# Patient Record
Sex: Female | Born: 1950
Health system: Southern US, Community
[De-identification: ages and names within clinical notes are randomized; demographics above are authoritative.]

## PROBLEM LIST (undated history)

## (undated) DIAGNOSIS — K649 Unspecified hemorrhoids: Secondary | ICD-10-CM

## (undated) DIAGNOSIS — F419 Anxiety disorder, unspecified: Secondary | ICD-10-CM

## (undated) DIAGNOSIS — F329 Major depressive disorder, single episode, unspecified: Secondary | ICD-10-CM

## (undated) DIAGNOSIS — R030 Elevated blood-pressure reading, without diagnosis of hypertension: Secondary | ICD-10-CM

## (undated) DIAGNOSIS — R55 Syncope and collapse: Secondary | ICD-10-CM

## (undated) DIAGNOSIS — R569 Unspecified convulsions: Secondary | ICD-10-CM

## (undated) DIAGNOSIS — F32A Depression, unspecified: Secondary | ICD-10-CM

## (undated) DIAGNOSIS — G4089 Other seizures: Secondary | ICD-10-CM

## (undated) DIAGNOSIS — H469 Unspecified optic neuritis: Secondary | ICD-10-CM

## (undated) DIAGNOSIS — M810 Age-related osteoporosis without current pathological fracture: Secondary | ICD-10-CM

## (undated) DIAGNOSIS — G43909 Migraine, unspecified, not intractable, without status migrainosus: Secondary | ICD-10-CM

## (undated) DIAGNOSIS — G35 Multiple sclerosis: Secondary | ICD-10-CM

## (undated) DIAGNOSIS — I10 Essential (primary) hypertension: Secondary | ICD-10-CM

## (undated) DIAGNOSIS — K5190 Ulcerative colitis, unspecified, without complications: Secondary | ICD-10-CM

## (undated) HISTORY — DX: Ulcerative colitis, unspecified, without complications: K51.90

## (undated) HISTORY — DX: Age-related osteoporosis without current pathological fracture: M81.0

## (undated) HISTORY — DX: Anxiety disorder, unspecified: F41.9

## (undated) HISTORY — DX: Unspecified optic neuritis: H46.9

## (undated) HISTORY — DX: Elevated blood-pressure reading, without diagnosis of hypertension: R03.0

## (undated) HISTORY — DX: Unspecified convulsions: R56.9

## (undated) HISTORY — DX: Multiple sclerosis: G35

## (undated) HISTORY — DX: Unspecified hemorrhoids: K64.9

## (undated) HISTORY — DX: Major depressive disorder, single episode, unspecified: F32.9

## (undated) HISTORY — DX: Essential (primary) hypertension: I10

## (undated) HISTORY — DX: Migraine, unspecified, not intractable, without status migrainosus: G43.909

## (undated) HISTORY — PX: INCONTINENCE SURGERY: SHX676

## (undated) HISTORY — DX: Syncope and collapse: R55

## (undated) HISTORY — PX: OVARIAN CYST SURGERY: SHX726

## (undated) HISTORY — PX: FOOT SURGERY: SHX648

## (undated) HISTORY — DX: Depression, unspecified: F32.A

## (undated) HISTORY — PX: BREAST LUMPECTOMY: SHX2

---

## 1999-03-16 ENCOUNTER — Other Ambulatory Visit: Admission: RE | Admit: 1999-03-16 | Discharge: 1999-03-16 | Payer: Self-pay | Admitting: Obstetrics and Gynecology

## 2000-04-04 ENCOUNTER — Other Ambulatory Visit: Admission: RE | Admit: 2000-04-04 | Discharge: 2000-04-04 | Payer: Self-pay | Admitting: Obstetrics and Gynecology

## 2002-12-10 ENCOUNTER — Other Ambulatory Visit: Admission: RE | Admit: 2002-12-10 | Discharge: 2002-12-10 | Payer: Self-pay | Admitting: Obstetrics and Gynecology

## 2004-02-13 ENCOUNTER — Other Ambulatory Visit: Admission: RE | Admit: 2004-02-13 | Discharge: 2004-02-13 | Payer: Self-pay | Admitting: Obstetrics and Gynecology

## 2005-03-06 ENCOUNTER — Other Ambulatory Visit: Admission: RE | Admit: 2005-03-06 | Discharge: 2005-03-06 | Payer: Self-pay | Admitting: Obstetrics and Gynecology

## 2006-03-25 ENCOUNTER — Other Ambulatory Visit: Admission: RE | Admit: 2006-03-25 | Discharge: 2006-03-25 | Payer: Self-pay | Admitting: Obstetrics and Gynecology

## 2010-10-19 ENCOUNTER — Ambulatory Visit (HOSPITAL_COMMUNITY): Admission: RE | Admit: 2010-10-19 | Discharge: 2010-10-19 | Payer: Self-pay | Admitting: Obstetrics and Gynecology

## 2011-02-19 LAB — BASIC METABOLIC PANEL
BUN: 13 mg/dL (ref 6–23)
Calcium: 9.5 mg/dL (ref 8.4–10.5)
Creatinine, Ser: 0.6 mg/dL (ref 0.4–1.2)
GFR calc non Af Amer: >60 mL/min (ref >60)
Glucose, Bld: 79 mg/dL (ref 70–99)

## 2011-02-19 LAB — URINALYSIS, ROUTINE W REFLEX MICROSCOPIC
Ketones, ur: NEGATIVE mg/dL
Protein, ur: NEGATIVE mg/dL
Urobilinogen, UA: 0.2 mg/dL (ref 0.0–1.0)

## 2011-02-19 LAB — CBC
MCHC: 34 g/dL (ref 30.0–36.0)
RDW: 13.6 % (ref 11.5–15.5)

## 2011-02-25 ENCOUNTER — Other Ambulatory Visit (HOSPITAL_COMMUNITY): Payer: Self-pay | Admitting: Internal Medicine

## 2012-01-27 DIAGNOSIS — R55 Syncope and collapse: Secondary | ICD-10-CM | POA: Insufficient documentation

## 2012-01-27 DIAGNOSIS — G43109 Migraine with aura, not intractable, without status migrainosus: Secondary | ICD-10-CM | POA: Insufficient documentation

## 2012-01-27 DIAGNOSIS — G35 Multiple sclerosis: Secondary | ICD-10-CM | POA: Insufficient documentation

## 2012-01-27 DIAGNOSIS — I6789 Other cerebrovascular disease: Secondary | ICD-10-CM | POA: Insufficient documentation

## 2012-03-26 ENCOUNTER — Ambulatory Visit: Payer: Self-pay | Admitting: Women's Health

## 2013-02-27 ENCOUNTER — Other Ambulatory Visit: Payer: Self-pay | Admitting: Neurology

## 2013-03-18 ENCOUNTER — Other Ambulatory Visit: Payer: Self-pay

## 2013-03-18 MED ORDER — CLONAZEPAM 0.5 MG PO TABS: ORAL_TABLET | ORAL | Status: DC

## 2013-03-18 NOTE — Telephone Encounter (Signed)
Patient called requesting refill on Klonopin to last until OV in late May.

## 2013-04-27 ENCOUNTER — Encounter: Payer: Self-pay | Admitting: Neurology

## 2013-04-27 ENCOUNTER — Ambulatory Visit: Payer: Self-pay | Admitting: Neurology

## 2013-04-27 DIAGNOSIS — R55 Syncope and collapse: Secondary | ICD-10-CM

## 2013-04-27 DIAGNOSIS — G43109 Migraine with aura, not intractable, without status migrainosus: Secondary | ICD-10-CM

## 2013-04-27 DIAGNOSIS — I6789 Other cerebrovascular disease: Secondary | ICD-10-CM

## 2013-04-27 DIAGNOSIS — G35 Multiple sclerosis: Secondary | ICD-10-CM

## 2013-06-22 ENCOUNTER — Other Ambulatory Visit: Payer: Self-pay | Admitting: Neurology

## 2013-06-22 NOTE — Telephone Encounter (Signed)
Patient has not been seen since Feb 2013.  Had an appt in May 2014, which they did not show up for.  Now scheduled again for Jan 2015.

## 2013-08-31 ENCOUNTER — Other Ambulatory Visit: Payer: Self-pay | Admitting: Neurology

## 2013-12-14 ENCOUNTER — Ambulatory Visit (INDEPENDENT_AMBULATORY_CARE_PROVIDER_SITE_OTHER): Payer: BC Managed Care – PPO | Admitting: Neurology

## 2013-12-14 ENCOUNTER — Encounter: Payer: Self-pay | Admitting: Neurology

## 2013-12-14 ENCOUNTER — Encounter (INDEPENDENT_AMBULATORY_CARE_PROVIDER_SITE_OTHER): Payer: Self-pay

## 2013-12-14 VITALS — BP 132/65 | HR 81 | Wt 108.5 lb

## 2013-12-14 DIAGNOSIS — G35 Multiple sclerosis: Secondary | ICD-10-CM

## 2013-12-14 DIAGNOSIS — G43109 Migraine with aura, not intractable, without status migrainosus: Secondary | ICD-10-CM

## 2013-12-14 MED ORDER — CLONAZEPAM 0.5 MG PO TABS: ORAL_TABLET | ORAL | Status: DC

## 2013-12-14 MED ORDER — BACLOFEN 10 MG PO TABS: 10.0000 mg | ORAL_TABLET | Freq: Three times a day (TID) | ORAL | Status: DC | PRN

## 2013-12-14 NOTE — Patient Instructions (Signed)
Multiple Sclerosis Multiple sclerosis (MS) is a disease of the central nervous system. Its cause is unknown. It is more common in the northern states than in the southern states. There is a higher incidence of MS in women. There is a wide variation in the symptoms (problems) of MS. This is because of the many different ways it affects the central nervous system. It often comes on in episodes or attacks. These attacks may last weeks to months. There may be long periods of nearly no problems between attacks. The main symptoms include visual problems (associated with eye pain), numbness, weakness, and paralysis in extremities (arms/hands and legs/feet). There may also be tremors and problems with balance and walking. The age when MS starts is variable. Advances in medicine continue to improve the treatment of this illness. There is no known cure for MS but there are medications that help. MS is not an inherited illness, although your risk of getting this disease is higher if you have a relative with MS. The best radiologic (x-ray) study for MS is an MRI (magnetic resonance imaging). There are medications available to decrease the number and frequency of attacks. SYMPTOMS  The symptoms of MS are caused by loss of insulation (myelin) of the nerves of the brain. When this happens, brain signals do not get transmitted properly or may not get transmitted at all. Some of the problems caused by this include:   Numbness.  Weakness.  Paralysis in extremities.  Visual problems, eye pain.  Balance problems.  Tremors. DIAGNOSIS  Your caregiver can do studies on you to make this diagnosis. This may include specialized X-rays and spinal fluid studies. HOME CARE INSTRUCTIONS   Take medications as directed by your caregiver. Baclofen is a drug commonly used to reduce muscle spasticity. Steroids are often used for short term relief.  Exercise as directed.  Use physical and occupational therapy as directed by  your caregiver. Careful attention to this medical care can help avoid depression.  See your caregiver if you begin to have problems with depression. This is a common problem in MS. Patients often continue to work many years after the diagnosis of MS. Document Released: 11/22/2000 Document Revised: 02/17/2012 Document Reviewed: 07/01/2007 Exit[employer] Patient Information 2014 [employer].  

## 2013-12-14 NOTE — Progress Notes (Signed)
Reason for visit: Multiple sclerosis  Makayla Wade is an 63 y.o. female  History of present illness:  Makayla Wade is a 63 year old right-handed white female with a history of multiple sclerosis, but she is not on any disease modifying agents. The patient has done quite well over a number of years without any new symptoms. The patient is under a lot of stress with her job currently, and she will have intermittent numbness of the lower left leg. The patient describes no new weakness, balance changes, vision changes. The patient will have occasional blurring of the left eye. The patient in the past has had episodes of visual disturbance associated with classic migraine. The patient reports no significant problems with migraine headaches at this time. The patient is on baclofen and clonazepam, and this seemed to improve her symptoms. The patient returns for an evaluation. The patient denies any problems controlling the bowels or the bladder.  Past Medical History  Diagnosis Date  . Multiple sclerosis   . Syncope   . Seizures     Hx.   Marland Kitchen. Optic neuritis, right   . Depression     Hx.  Marland Kitchen. Hemorrhoids     Hx.  . Migraine headache   . Ulcerative colitis     Past Surgical History  Procedure Laterality Date  . Cesarean section    . Foot surgery    . Breast lumpectomy    . Ovarian cyst surgery    . Incontinence surgery      Family History  Problem Relation Age of Onset  . Cancer Mother   . Heart attack Father   . Cancer Father   . Cancer Maternal Grandmother   . Cancer Paternal Grandfather     Social history:  reports that she quit smoking about 45 years ago. She has never used smokeless tobacco. She reports that she drinks alcohol. She reports that she does not use illicit drugs.    Allergies  Allergen Reactions  . Sulfa Antibiotics     Medications:  Current Outpatient Prescriptions on File Prior to Visit  Medication Sig Dispense Refill  . alendronate (FOSAMAX) 70 MG  tablet Take 70 mg by mouth every 7 (seven) days. Take with a full glass of water on an empty stomach.      . Calcium Carbonate-Vitamin D (CALTRATE 600+D) 600-400 MG-UNIT per tablet Take 1 tablet by mouth daily.      . cholecalciferol (VITAMIN D) 1000 UNITS tablet Take 1,000 Units by mouth daily.       No current facility-administered medications on file prior to visit.    ROS:  Out of a complete 14 system review of symptoms, the patient complains only of the following symptoms, and all other reviewed systems are negative.  Achy muscles, muscle cramps, coordination problems with fatigue  Urinary urgency  Blood pressure 132/65, pulse 81, weight 108 lb 8 oz (49.215 kg).  Physical Exam  General: The patient is alert and cooperative at the time of the examination.  Skin: No significant peripheral edema is noted.   Neurologic Exam  Mental status: The patient is oriented x 3.  Cranial nerves: Facial symmetry is present. Speech is normal, no aphasia or dysarthria is noted. Extraocular movements are full. Visual fields are full. Pupils are equal, round, and reactive to light. Discs are flat bilaterally.  Motor: The patient has good strength in all 4 extremities.  Sensory examination: Soft touch sensation on the face, arms, and legs is symmetric, with  exception of some decreased soft touch sensation on the left foot.  Coordination: The patient has good finger-nose-finger and heel-to-shin bilaterally.  Gait and station: The patient has a normal gait. Tandem gait is normal. Romberg is negative. No drift is seen.  Reflexes: Deep tendon reflexes are symmetric.   Assessment/Plan:  One. Multiple sclerosis  2. Classic migraine  The patient is doing quite well with her neurologic symptoms at this point. The patient was given prescriptions for her baclofen and her clonazepam. The patient was last seen through this office on 01/27/2012. The patient will followup in one year. The last MRI  the brain was done in 2010.  Marlan Palau MD 12/14/2013 7:36 PM  [facility] [address] [place], [place] [address]  Phone [phone] Fax [phone]

## 2014-04-25 ENCOUNTER — Telehealth: Payer: Self-pay | Admitting: Neurology

## 2014-04-25 NOTE — Telephone Encounter (Signed)
Tried to call patient, the number left was a voice message for a Dillon Bjork.  I tried the two other numbers listed, [phone] wrong number and [phone] no longer in service.   I left a message for the patient to return call at the [phone] that was left.  I also left message at [phone], which is emergency contact #, and asked patient to return call to office, or if emergency to go to ER.

## 2014-04-25 NOTE — Telephone Encounter (Signed)
Patient is still waiting, called and is very upset that she hasn't heard back yet. She wants to be seen today. Advised patient that if she is in a lot of pain she should go to the urgent care.

## 2014-04-25 NOTE — Telephone Encounter (Signed)
I called the patient again and I left another message. The patient is describing stroke like symptoms, she needs to go to the ER for an evaluation. I have left this message several times, and I was able to reach the son as well who was going to contact the patient.

## 2014-04-25 NOTE — Telephone Encounter (Signed)
I called the number is given, unable to reach the patient. I finally talked with the son, who is not aware that the patient was having any problems. He indicated that she has lost her cell phone, and she is using her uncle's cell phone at [phone]. I have left messages on his telephone, unable to reach the patient.

## 2014-04-25 NOTE — Telephone Encounter (Signed)
I called the patient again, left a message, I will call back later. 

## 2014-04-25 NOTE — Telephone Encounter (Signed)
Error

## 2014-04-25 NOTE — Telephone Encounter (Signed)
Patient calling and stating she feels terrible, she has L foot and hand heaviness with lack of coordination.  She has had a really bad headache over the weekend, but now a dull ache.  She slept for 10 hrs last night and feel exhausted.  Requesting an appointment for today.

## 2014-04-26 ENCOUNTER — Encounter: Payer: Self-pay | Admitting: Adult Health

## 2014-04-26 ENCOUNTER — Telehealth: Payer: Self-pay | Admitting: Neurology

## 2014-04-26 ENCOUNTER — Ambulatory Visit (INDEPENDENT_AMBULATORY_CARE_PROVIDER_SITE_OTHER): Payer: 59 | Admitting: Adult Health

## 2014-04-26 ENCOUNTER — Encounter (INDEPENDENT_AMBULATORY_CARE_PROVIDER_SITE_OTHER): Payer: Self-pay

## 2014-04-26 VITALS — BP 127/80 | HR 76 | Temp 98.4°F | Ht 61.0 in | Wt 109.0 lb

## 2014-04-26 DIAGNOSIS — G35 Multiple sclerosis: Secondary | ICD-10-CM

## 2014-04-26 DIAGNOSIS — G43109 Migraine with aura, not intractable, without status migrainosus: Secondary | ICD-10-CM

## 2014-04-26 NOTE — Telephone Encounter (Signed)
Called patient to see if she is able to come in @ 3:30 she will be on Megan's schedule but Dr. Anne Hahn will come in to see her.

## 2014-04-26 NOTE — Progress Notes (Signed)
PATIENT: Makayla Wade DOB: February 18, 1951  REASON FOR VISIT: follow up HISTORY FROM: patient  HISTORY OF PRESENT ILLNESS: Ms. Makayla Wade is a 63 year old right-handed white female with a history of multiple sclerosis and migraines. Patent is not currently on any disease modifying agents. The patient is on baclofen and clonazepam. Saturday into Sunday she had a severe headache, patient was having trouble using her left hand and leg. She was able to walk and use the left arm but had numbness in the arm and leg. She has had ocular migraines before but this headache was not consistent with previous migraines. She was working outside in the heat Saturday then the symptoms started. By Sunday she was completely exhausted and that night she slept for 10 hours. The numbness has improved but not resolved. She continues to have a dull headache that improves with tylenol. In the past the patient would have intermittent numbness of the left lower leg. Denies any changes in her speech. Denies any visual changes. Denies problems with the bladder and bowels. No new medical issues since the last visit.  REVIEW OF SYSTEMS: Full 14 system review of systems performed and notable only for:  Constitutional: N/A  Eyes: N/A Ear/Nose/Throat: N/A  Skin: N/A  Cardiovascular: N/A  Respiratory: N/A  Gastrointestinal: N/A  Genitourinary: N/A Hematology/Lymphatic: N/A  Endocrine: N/A Musculoskeletal:N/A  Allergy/Immunology: N/A  Neurological: headache, numbness, weakness Psychiatric: N/A Sleep: N/A   ALLERGIES: Allergies  Allergen Reactions  . Sulfa Antibiotics     Funny sensations, rapid heartbeats    HOME MEDICATIONS: Outpatient Prescriptions Prior to Visit  Medication Sig Dispense Refill  . alendronate (FOSAMAX) 70 MG tablet Take 70 mg by mouth every 7 (seven) days. Take with a full glass of water on an empty stomach.      . baclofen (LIORESAL) 10 MG tablet Take 1 tablet (10 mg total) by mouth 3 (three)  times daily as needed for muscle spasms.  90 tablet  5  . Calcium Carbonate-Vitamin D (CALTRATE 600+D) 600-400 MG-UNIT per tablet Take 1 tablet by mouth daily.      . cholecalciferol (VITAMIN D) 1000 UNITS tablet Take 1,000 Units by mouth daily.      . clonazePAM (KLONOPIN) 0.5 MG tablet One tablet in the morning and 1/2 tablet in the evening  45 tablet  5  . Multiple Vitamins-Minerals (CENTRUM SILVER ADULT 50+ PO) Take 1 tablet by mouth daily.       No facility-administered medications prior to visit.    PAST MEDICAL HISTORY: Past Medical History  Diagnosis Date  . Multiple sclerosis   . Syncope   . Seizures     Hx.   Marland Kitchen Optic neuritis, right   . Depression     Hx.  Marland Kitchen Hemorrhoids     Hx.  . Migraine headache   . Ulcerative colitis     PAST SURGICAL HISTORY: Past Surgical History  Procedure Laterality Date  . Cesarean section    . Foot surgery    . Breast lumpectomy    . Ovarian cyst surgery    . Incontinence surgery      FAMILY HISTORY: Family History  Problem Relation Age of Onset  . Cancer Mother   . Heart attack Father   . Cancer Father   . Cancer Maternal Grandmother   . Cancer Paternal Grandfather     SOCIAL HISTORY: History   Social History  . Marital Status: Divorced    Spouse Name: N/A    Number  of Children: 3  . Years of Education: HS   Occupational History  . Editor, commissioning     [facility] History Main Topics  . Smoking status: Former Smoker    Quit date: 12/09/1968  . Smokeless tobacco: Never Used  . Alcohol Use: Yes     Comment: Consumes 2 glasses of wine daily  . Drug Use: No  . Sexual Activity: Not on file   Other Topics Concern  . Not on file   Social History Narrative   Patient is right handed,resides alone      PHYSICAL EXAM  Filed Vitals:   04/26/14 1543  BP: 127/80  Pulse: 76  Temp: 98.4 F (36.9 C)  TempSrc: Oral  Height: _0  (1.549 m)  Weight: 109 lb (49.442 kg)   Body  mass index is 20.61 kg/(m^2).  Generalized: Well developed, in no acute distress   Neurological examination  Mentation: Alert oriented to time, place, history taking. Follows all commands speech and language fluent Cranial nerve II-XII: Fundoscopic exam reveals sharp disc margins.Pupils were equal round reactive to light extraocular movements were full, visual field were full on confrontational test. Facial sensation and strength were normal. hearing was intact to finger rubbing bilaterally.  Motor: The motor testing reveals 5 over 5 strength of all 4 extremities. Good symmetric motor tone is noted throughout.  Sensory: Sensory testing is intact to  soft touch and vibration sensation on all 4 extremities.Decreased pinprick in left foot up to mid calf and in the left hand to the wrist. No evidence of extinction is noted.  Coordination: Cerebellar testing reveals good finger-nose-finger and heel-to-shin bilaterally.  Gait and station: Gait is normal. Tandem gait is minimally unsteady. Romberg is negative. No drift is seen.  Reflexes: Deep tendon reflexes are symmetric and normal bilaterally. Toes are downgoing bilaterally.   DIAGNOSTIC DATA (LABS, IMAGING, TESTING) - I reviewed patient records, labs, notes, testing and imaging myself where available.  Lab Results  Component Value Date   WBC 4.5 10/16/2010   HGB 13.1 10/16/2010   HCT 38.6 10/16/2010   MCV 99.2 10/16/2010   PLT 205 10/16/2010      Component Value Date/Time   NA 142 10/16/2010 1000   K 4.3 10/16/2010 1000   CL 108 10/16/2010 1000   CO2 30 10/16/2010 1000   GLUCOSE 79 10/16/2010 1000   BUN 13 10/16/2010 1000   CREATININE 0.60 10/16/2010 1000   CALCIUM 9.5 10/16/2010 1000   GFRNONAA >60 10/16/2010 1000   GFRAA  Value: >60        The eGFR has been calculated using the MDRD equation. This calculation has not been validated in all clinical situations. eGFR's persistently <60 mL/min signify possible Chronic Kidney Disease. 10/16/2010 1000      ASSESSMENT AND PLAN 63 y.o. year old female  has a past medical history of Multiple sclerosis; Syncope; Seizures; Optic neuritis, right; Depression; Hemorrhoids; Migraine headache; and Ulcerative colitis. here with:  1. Multiple sclerosis 2. Migraine with aura, without mention of intractable migraine without mention of status migrainosus  Patient having numbness in left hand and increased numbness in left leg also accompanied by a headache that started Saturday after working outdoors. She states she was completely fatigued on Sunday and slept for 10 hours that night. This most likely is consistent with MS. Patient has not had a repeat MRI since 2010. We will order an MRI to look for progression of MS. Patient is claustrophobic,  we will give her xanax to take prior to MRI. Patient should follow up in 6 months or sooner if needed.    Ward Givens, MSN, NP-C 04/26/2014, 3:53 PM [facility] [address], [place], [place] [address] [phone]  Note: This document was prepared with digital dictation and possible smart phrase technology. Any transcriptional errors that result from this process are unintentional.

## 2014-04-26 NOTE — Telephone Encounter (Signed)
Pt called very upset and states no one listen to her, that all the #'s that we called were all the #'s she request not to call. Please call pt back at [phone].  °

## 2014-04-26 NOTE — Telephone Encounter (Signed)
Pt called very upset and states no one listen to her, that all the #'s that we called were all the #'s she request not to call. Please call pt back at [phone].

## 2014-04-26 NOTE — Patient Instructions (Signed)
Multiple Sclerosis  Multiple sclerosis (MS) is a disease of the central nervous system. It leads to loss of the insulating covering of the nerves (myelin sheath) of your brain. When this happens, brain signals do not get transmitted properly or may not get transmitted at all. The symptoms of MS occur in episodes or attacks. These attacks may last weeks to months. There may be long periods of nearly no problems between attacks. The age of onset of MS varies.   CAUSES  The cause of MS is unknown. However, it is more common in the [place] than in the southern [place].  RISK FACTORS  There is a higher incidence of MS in women than in men. MS is not an inherited illness, although your risk of MS is higher if you have a relative with MS.  SIGNS AND SYMPTOMS   The symptoms of MS occur in episodes or attacks. These attacks may last weeks to months. There may be long periods of almost no symptoms between attacks.  The symptoms of MS vary. This is because of the many different ways it affects the central nervous system. The main symptoms of MS include:   Vision problems and eye pain.   Numbness.   Weakness.   Paralysis in your arms, hands, feet, and legs (extremities).   Balance problems.   Tremors.  DIAGNOSIS   Your health care provider can diagnose MS with the help of imaging exams and lab tests. These may include specialized X-ray exams and spinal fluid tests. The best imaging exam to confirm a diagnosis of MS is MRI.  TREATMENT   There is no known cure for MS, but there are medicines that can decrease the number and frequency of attacks. Steroids are often used for short-term relief. Physical and occupational therapy may also help.  HOME CARE INSTRUCTIONS    Take medicines as directed by your health care provider.   Exercise as directed by your health care provider.  SEEK MEDICAL CARE IF:  You begin to feel depressed.  SEEK IMMEDIATE MEDICAL CARE IF:   You develop paralysis.   You develop  problems with bladder, bowel, or sexual function.   You develop mental changes, such as forgetfulness or mood swings.   You have a seizure.  Document Released: 11/22/2000 Document Revised: 09/15/2013 Document Reviewed: 08/02/2013  [employer] Patient Information 2014 [employer].

## 2014-04-26 NOTE — Progress Notes (Signed)
I have read the note, and I agree with the clinical assessment and plan.  Treasure Ochs K Kanesha Cadle   

## 2014-05-19 ENCOUNTER — Ambulatory Visit
Admission: RE | Admit: 2014-05-19 | Discharge: 2014-05-19 | Disposition: A | Discharge status: Home / Self Care | Payer: 59 | Source: Ambulatory Visit | Attending: Adult Health | Admitting: Adult Health

## 2014-05-19 ENCOUNTER — Other Ambulatory Visit: Payer: Self-pay

## 2014-05-19 DIAGNOSIS — G35 Multiple sclerosis: Secondary | ICD-10-CM

## 2014-05-19 MED ORDER — GADOBENATE DIMEGLUMINE 529 MG/ML IV SOLN
9.0000 mL | Freq: Once | INTRAVENOUS | Status: AC | PRN
  Administered 2014-05-19: 9 mL via INTRAVENOUS

## 2014-05-19 MED ORDER — GADOBENATE DIMEGLUMINE 529 MG/ML IV SOLN: 9.0000 mL | Freq: Once | INTRAVENOUS | Status: AC | PRN

## 2014-05-20 ENCOUNTER — Telehealth: Payer: Self-pay | Admitting: Adult Health

## 2014-05-20 NOTE — Progress Notes (Signed)
Quick Note:  Shared results with patient, and wanted to know how does this explain what his happening to her now ______

## 2014-05-20 NOTE — Telephone Encounter (Signed)
I called the patient. Dr. Pearlean Brownie compared the two MRI scans and reports that there has been no change or progression in her MS. I called to let the patient know. She verbalized understanding. I explained that the left hand weakness and left leg weakness was most likely an exacerbation of her MS. She verbalized understanding.

## 2014-05-20 NOTE — Telephone Encounter (Signed)
I called the patient to let her know that her MRI had not been compared to the previous MRI. I have requested that Dr. Pearlean BrownieSethi compare the 2 scans. I advised the patient that I would call her back this afternoon once he has read them to tell her the results.

## 2014-05-20 NOTE — Telephone Encounter (Signed)
Message copied by Enedina FinnerMILLIKAN, Dodi Leu P on Fri May 20, 2014 11:20 AM ------      Message from: Harlon FlorSOUTHERLAND, AMMIE L      Created: Fri May 20, 2014 11:02 AM       Shared results with patient, and wanted to know how does this explain what his happening to her now ------

## 2014-06-16 NOTE — Telephone Encounter (Signed)
done

## 2014-07-09 ENCOUNTER — Other Ambulatory Visit: Payer: Self-pay | Admitting: Neurology

## 2014-07-11 NOTE — Telephone Encounter (Signed)
Rx signed and faxed.

## 2014-09-20 ENCOUNTER — Other Ambulatory Visit: Payer: Self-pay | Admitting: Neurology

## 2014-10-26 ENCOUNTER — Encounter: Payer: Self-pay | Admitting: Neurology

## 2014-10-28 ENCOUNTER — Ambulatory Visit: Payer: 59 | Admitting: Adult Health

## 2014-11-01 ENCOUNTER — Encounter: Payer: Self-pay | Admitting: Neurology

## 2014-11-01 ENCOUNTER — Ambulatory Visit: Payer: 59 | Admitting: Neurology

## 2014-12-14 ENCOUNTER — Ambulatory Visit (INDEPENDENT_AMBULATORY_CARE_PROVIDER_SITE_OTHER): Payer: 59 | Admitting: Neurology

## 2014-12-14 ENCOUNTER — Encounter: Payer: Self-pay | Admitting: Neurology

## 2014-12-14 VITALS — BP 141/83 | HR 74 | Ht 59.0 in | Wt 113.0 lb

## 2014-12-14 DIAGNOSIS — G35 Multiple sclerosis: Secondary | ICD-10-CM

## 2014-12-14 DIAGNOSIS — G43109 Migraine with aura, not intractable, without status migrainosus: Secondary | ICD-10-CM

## 2014-12-14 NOTE — Progress Notes (Signed)
Reason for visit: Multiple sclerosis  Makayla Wade is an 64 y.o. female  History of present illness:  Ms. Due is a 64 year old right-handed white female with a history of multiple sclerosis since the 34s. The patient has done extremely well over the years, but in late spring of 2015, she had onset of some left-sided weakness that has improved over time. MRI of the brain did not show any clear progression from a prior study done 5 years before. The patient has not been on any disease modifying agents, she is concerned about the potential side effects. The patient has also had some problems with migraine headaches, but more recently, the migraine has essentially gone away. She has not noted any other new symptoms of numbness, weakness, gait disturbance, or visual complaints. The patient denies any significant problems with memory or concentration. She recently lost her job, and she is concerned about this issue. She returns for an evaluation.  Past Medical History  Diagnosis Date  . Multiple sclerosis   . Syncope   . Seizures     Hx.   Marland Kitchen Optic neuritis, right   . Depression     Hx.  Marland Kitchen Hemorrhoids     Hx.  . Migraine headache   . Ulcerative colitis     Past Surgical History  Procedure Laterality Date  . Cesarean section    . Foot surgery    . Breast lumpectomy    . Ovarian cyst surgery    . Incontinence surgery      Family History  Problem Relation Age of Onset  . Cancer Mother   . Heart attack Father   . Cancer Father   . Cancer Maternal Grandmother   . Cancer Paternal Grandfather     Social history:  reports that she quit smoking about 46 years ago. She has never used smokeless tobacco. She reports that she drinks alcohol. She reports that she does not use illicit drugs.    Allergies  Allergen Reactions  . Sulfa Antibiotics     Funny sensations, rapid heartbeats    Medications:  Current Outpatient Prescriptions on File Prior to Visit  Medication Sig  Dispense Refill  . baclofen (LIORESAL) 10 MG tablet Take 1 tablet (10 mg total) by mouth 3 (three) times daily as needed for muscle spasms. 90 tablet 5  . Calcium Carbonate-Vitamin D (CALTRATE 600+D) 600-400 MG-UNIT per tablet Take 1 tablet by mouth daily.    . cholecalciferol (VITAMIN D) 1000 UNITS tablet Take 1,000 Units by mouth daily.    . clonazePAM (KLONOPIN) 0.5 MG tablet TAKE 1 TABLET BY MOUTH IN THE MORNING AND 1/2 TABLET IN THE EVENING 45 tablet 5  . Multiple Vitamins-Minerals (CENTRUM SILVER ADULT 50+ PO) Take 1 tablet by mouth daily.     No current facility-administered medications on file prior to visit.    ROS:  Out of a complete 14 system review of symptoms, the patient complains only of the following symptoms, and all other reviewed systems are negative.  Blurred vision Fatigue  Blood pressure 141/83, pulse 74, height  (1.499 m), weight 113 lb (51.256 kg).  Physical Exam  General: The patient is alert and cooperative at the time of the examination.  Skin: No significant peripheral edema is noted.   Neurologic Exam  Mental status: The patient is oriented x 3.  Cranial nerves: Facial symmetry is present. Speech is normal, no aphasia or dysarthria is noted. Extraocular movements are full. Visual fields are  full.  Motor: The patient has good strength in all 4 extremities.  Sensory examination: Soft touch sensation is symmetric on the face, arms, and legs.  Coordination: The patient has good finger-nose-finger and heel-to-shin bilaterally.  Gait and station: The patient has a normal gait. Tandem gait is normal. Romberg is negative. No drift is seen.  Reflexes: Deep tendon reflexes are symmetric.   MRI brain 05/19/14:  IMPRESSION: Multiple foci of white matter T2 signal abnormality, consistent with multiple sclerosis. No enhancing lesions seen to suggest active demyelination.    Assessment/Plan:  1. Multiple sclerosis  2. Migraine  headache  The patient is doing relatively well at this point. She has not had any new symptoms of multiple sclerosis. Her migraine headaches are much better. She will continue to be monitored. She indicates that she gets annual blood work done through her [facility] physician. She will follow-up in 8 months.  Marlan Palau MD 12/14/2014 11:11 AM  [facility] [address] [place], [place] [address]  Phone [phone] Fax [phone]

## 2014-12-14 NOTE — Patient Instructions (Signed)

## 2015-01-11 ENCOUNTER — Other Ambulatory Visit: Payer: Self-pay | Admitting: Neurology

## 2015-01-11 NOTE — Telephone Encounter (Signed)
Rx signed and faxed.

## 2015-04-12 ENCOUNTER — Telehealth: Payer: Self-pay | Admitting: Neurology

## 2015-04-12 NOTE — Telephone Encounter (Signed)
The patient was seen by her ophthalmologist. She sees Dr. Lemar Lofty. No significant ocular. findings were noted.

## 2015-08-02 ENCOUNTER — Other Ambulatory Visit: Payer: Self-pay | Admitting: Neurology

## 2015-08-03 ENCOUNTER — Other Ambulatory Visit: Payer: Self-pay

## 2015-08-03 MED ORDER — CLONAZEPAM 0.5 MG PO TABS: ORAL_TABLET | ORAL | Status: DC

## 2015-08-03 NOTE — Telephone Encounter (Signed)
Last prescribed by Dr Anne Hahn on 01/11/2015

## 2015-08-04 NOTE — Telephone Encounter (Signed)
Rx was signed and faxed   

## 2015-08-15 ENCOUNTER — Ambulatory Visit: Payer: 59 | Admitting: Neurology

## 2015-08-24 ENCOUNTER — Telehealth: Payer: Self-pay

## 2015-08-24 NOTE — Telephone Encounter (Signed)
Called and spoke to patient to offer her apt with [facility] Patient relayed she only want's to see Dr. Anne Hahn.

## 2015-08-24 NOTE — Telephone Encounter (Signed)
Left voicemail asking the patient to call back about her upcoming appointment. I would like to move her to Megan's schedule if she does not mind.

## 2015-09-13 ENCOUNTER — Ambulatory Visit: Payer: 59 | Admitting: Neurology

## 2015-09-14 ENCOUNTER — Other Ambulatory Visit: Payer: Self-pay | Admitting: Adult Health

## 2015-09-15 ENCOUNTER — Other Ambulatory Visit: Payer: Self-pay

## 2015-09-15 MED ORDER — CLONAZEPAM 0.5 MG PO TABS: ORAL_TABLET | ORAL | Status: DC

## 2015-09-15 NOTE — Telephone Encounter (Signed)
Has appt scheduled in Feb  

## 2015-09-15 NOTE — Telephone Encounter (Signed)
Rx signed and faxed.

## 2015-10-17 ENCOUNTER — Telehealth: Payer: Self-pay | Admitting: Neurology

## 2015-10-17 MED ORDER — BACLOFEN 10 MG PO TABS: 10.0000 mg | ORAL_TABLET | Freq: Three times a day (TID) | ORAL | Status: DC | PRN

## 2015-10-17 NOTE — Telephone Encounter (Signed)
Pt needs refill on baclofen (LIORESAL) 10 MG tablet. Please sen to  [facility], phone [phone], fax: [phone]

## 2015-10-17 NOTE — Telephone Encounter (Signed)
Rx has been sent.  Receipt confirmed by pharmacy.   

## 2016-01-12 ENCOUNTER — Other Ambulatory Visit: Payer: Self-pay | Admitting: Neurology

## 2016-02-01 ENCOUNTER — Encounter: Payer: Self-pay | Admitting: Neurology

## 2016-02-01 ENCOUNTER — Ambulatory Visit (INDEPENDENT_AMBULATORY_CARE_PROVIDER_SITE_OTHER): Payer: BLUE CROSS/BLUE SHIELD | Admitting: Neurology

## 2016-02-01 VITALS — BP 150/100 | HR 67 | Ht 59.0 in | Wt 119.5 lb

## 2016-02-01 DIAGNOSIS — G43109 Migraine with aura, not intractable, without status migrainosus: Secondary | ICD-10-CM | POA: Diagnosis not present

## 2016-02-01 DIAGNOSIS — G35 Multiple sclerosis: Secondary | ICD-10-CM | POA: Diagnosis not present

## 2016-02-01 MED ORDER — BACLOFEN 10 MG PO TABS: 10.0000 mg | ORAL_TABLET | Freq: Three times a day (TID) | ORAL | Status: DC

## 2016-02-01 MED ORDER — CLONAZEPAM 0.5 MG PO TABS: 0.5000 mg | ORAL_TABLET | Freq: Two times a day (BID) | ORAL | Status: DC

## 2016-02-01 NOTE — Progress Notes (Signed)
Reason for visit: Multiple sclerosis  Makayla Wade is an 65 y.o. female  History of present illness:  Makayla Wade is a 65 year old right-handed white female with a history of multiple sclerosis, currently not on any disease modifying agents. The patient indicates that she is doing fairly well, she has not had any episodes of new numbness, weakness, gait disturbance, or changes in bowel or bladder function. The patient denies any visual changes. She has a history of migraine headaches, the headaches are relatively rare at this time. She is doing well in this regard. She is currently working for the city of [employer]. She finds this job is stressful for her. She takes baclofen and clonazepam for some mild spasticity involving the legs. The patient has not had any alteration in her ability to ambulate, she will stumble on occasion but she does not fall. She has had some intermittent episodes of tingling in the fingers of the hands bilaterally that may come and go, lasting about 15 minutes. This is generally more likely to occur when she is working. She returns to this office for an evaluation. The patient has had a bladder resuspension procedure, she continues to have some urinary urgency and frequency.  Past Medical History  Diagnosis Date  . Multiple sclerosis (HCC)   . Syncope   . Seizures (HCC)     Hx.   Marland Kitchen Optic neuritis, right   . Depression     Hx.  Marland Kitchen Hemorrhoids     Hx.  . Migraine headache   . Ulcerative colitis [facility])     Past Surgical History  Procedure Laterality Date  . Cesarean section    . Foot surgery    . Breast lumpectomy    . Ovarian cyst surgery    . Incontinence surgery      Family History  Problem Relation Age of Onset  . Cancer Mother   . Heart attack Father   . Cancer Father   . Cancer Maternal Grandmother   . Cancer Paternal Grandfather     Social history:  reports that she quit smoking about 47 years ago. She has never used smokeless tobacco. She  reports that she drinks about 4.2 oz of alcohol per week. She reports that she does not use illicit drugs.    Allergies  Allergen Reactions  . Sulfa Antibiotics     Funny sensations, rapid heartbeats  . Copper-Containing Compounds     Medications:  Prior to Admission medications   Medication Sig Start Date End Date Taking? Authorizing Provider  alendronate (FOSAMAX) 70 MG tablet Take 70 mg by mouth once a week. Take with a full glass of water on an empty stomach.   Yes Historical Provider, MD  baclofen (LIORESAL) 10 MG tablet Take 1 tablet (10 mg total) by mouth 3 (three) times daily as needed for muscle spasms. 10/17/15  Yes York Spaniel, MD  Calcium Carbonate-Vitamin D (CALTRATE 600+D) 600-400 MG-UNIT per tablet Take 1 tablet by mouth daily.   Yes Historical Provider, MD  cholecalciferol (VITAMIN D) 1000 UNITS tablet Take 1,000 Units by mouth daily.   Yes Historical Provider, MD  clonazePAM (KLONOPIN) 0.5 MG tablet TAKE 1 TABLET BY MOUTH EVERY MORNING AND 1/2 TABLET IN THE EVENING 09/15/15  Yes York Spaniel, MD  FLUZONE HIGH-DOSE 0.5 ML SUSY  09/30/14  Yes Historical Provider, MD  Multiple Vitamins-Minerals (CENTRUM SILVER ADULT 50+ PO) Take 1 tablet by mouth daily.   Yes Historical Provider, MD  ROS:  Out of a complete 14 system review of symptoms, the patient complains only of the following symptoms, and all other reviewed systems are negative.  Numbness Urinary frequency  Blood pressure 150/100, pulse 67, height  (1.499 m), weight 119 lb 8 oz (54.205 kg).  Physical Exam  General: The patient is alert and cooperative at the time of the examination.  Skin: No significant peripheral edema is noted.   Neurologic Exam  Mental status: The patient is alert and oriented x 3 at the time of the examination. The patient has apparent normal recent and remote memory, with an apparently normal attention span and concentration ability.   Cranial nerves: Facial symmetry  is present. Speech is normal, no aphasia or dysarthria is noted. Extraocular movements are full. Visual fields are full. Pupils are equal, round, and reactive to light. Discs are flat bilaterally.  Motor: The patient has good strength in all 4 extremities.  Sensory examination: Soft touch sensation is symmetric on the face, arms, and legs. Tinel's sign at the wrists were negative bilaterally.  Coordination: The patient has good finger-nose-finger and heel-to-shin bilaterally.  Gait and station: The patient has a normal gait. Tandem gait is minimally unsteady. Romberg is negative. No drift is seen.  Reflexes: Deep tendon reflexes are symmetric.   Assessment/Plan:  1. Multiple sclerosis  2. Migraine headache  The patient appears to have blood pressures that are running higher than usual. I have indicated that she is to check her blood pressures on a regular basis. If they remain elevated she is to contact her primary care physician to initiate treatment for hypertension. The patient could potentially be a candidate for a baclofen study that our office is conducting. The patient is interested in the study. The patient was given a prescription for the baclofen and for the clonazepam. The dose of clonazepam has been increased to 0.5 mg twice daily. She will follow-up in one year, sooner if needed. The migraine headaches are doing well at this time. If the numbness in the hands and fingers become more persistent, the patient will contact our office and we will consider EMG and nerve conduction study evaluation.  Marlan Palau MD 02/01/2016 8:36 AM  [facility] [address] [place], [place] [address]  Phone [phone] Fax [phone]

## 2016-02-01 NOTE — Patient Instructions (Signed)
Multiple Sclerosis °Multiple sclerosis (MS) is a disease of the central nervous system. It leads to the loss of the insulating covering of the nerves (myelin sheath) of your brain. When this happens, brain signals do not get sent properly or may not get sent at all. The age of onset of MS varies.  °CAUSES °The cause of MS is unknown. However, it is more common in the northern [place] than in the southern [place]. °RISK FACTORS °There is a higher number of women with MS than men. MS is not an illness that is passed down to you from your family members (inherited). However, your risk of MS is higher if you have a relative with MS. °SIGNS AND SYMPTOMS  °The symptoms of MS occur in episodes or attacks. These attacks may last weeks to months. There may be long periods of almost no symptoms between attacks. The symptoms of MS vary. This is because of the many different ways it affects the central nervous system. The main symptoms of MS include: °· Vision problems and eye pain. °· Numbness. °· Weakness. °· Inability to move your arms, hands, feet, or legs (paralysis). °· Balance problems. °· Tremors. °DIAGNOSIS  °Your health care provider can diagnose MS with the help of imaging exams and lab tests. These may include specialized X-ray exams and spinal fluid tests. The best imaging exam to confirm a diagnosis of MS is an MRI. °TREATMENT  °There is no known cure for MS, but there are medicines that can decrease the number and frequency of attacks. Steroids are often used for short-term relief. Physical and occupational therapy may also help. There are also many new alternative or complementary treatments available to help control the symptoms of MS. Ask your health care provider if any of these other options are right for you. °HOME CARE INSTRUCTIONS  °· Take medicines as directed by your health care provider. °· Exercise as directed by your health care provider. °SEEK MEDICAL CARE IF: °You begin to feel  depressed. °SEEK IMMEDIATE MEDICAL CARE IF: °· You develop paralysis. °· You have problems with bladder, bowel, or sexual function. °· You develop mental changes, such as forgetfulness or mood swings. °· You have a period of uncontrolled movements (seizure). °  °This information is not intended to replace advice given to you by your health care provider. Make sure you discuss any questions you have with your health care provider. °  °Document Released: 11/22/2000 Document Revised: 11/30/2013 Document Reviewed: 08/02/2013 °Elsevier Interactive Patient Education ©2016 Elsevier Inc. ° °

## 2016-02-13 ENCOUNTER — Other Ambulatory Visit: Payer: Self-pay | Admitting: Neurology

## 2016-08-22 ENCOUNTER — Telehealth: Payer: Self-pay

## 2016-08-22 MED ORDER — CLONAZEPAM 0.5 MG PO TABS: 0.5000 mg | ORAL_TABLET | Freq: Two times a day (BID) | ORAL | 1 refills | Status: DC

## 2016-08-22 NOTE — Addendum Note (Signed)
Addended by: Stephanie AcreWILLIS, Chauncy Mangiaracina on: 08/22/2016 06:14 PM   Modules accepted: Orders

## 2016-08-22 NOTE — Telephone Encounter (Signed)
Prescription for clonazepam was refilled.

## 2016-08-22 NOTE — Telephone Encounter (Signed)
Refill request sent from pharmacy, states there are no more refills left: Clonazepam 0.5mg  Please send to [facility]

## 2016-08-23 NOTE — Telephone Encounter (Signed)
Rx printed, signed, faxed to pharmacy. 

## 2017-01-31 ENCOUNTER — Ambulatory Visit (INDEPENDENT_AMBULATORY_CARE_PROVIDER_SITE_OTHER): Payer: Medicare Other | Admitting: Neurology

## 2017-01-31 ENCOUNTER — Encounter: Payer: Self-pay | Admitting: Neurology

## 2017-01-31 ENCOUNTER — Encounter: Payer: Self-pay | Admitting: *Deleted

## 2017-01-31 VITALS — BP 142/85 | HR 77 | Ht 59.0 in | Wt 119.5 lb

## 2017-01-31 DIAGNOSIS — G43109 Migraine with aura, not intractable, without status migrainosus: Secondary | ICD-10-CM

## 2017-01-31 DIAGNOSIS — G35 Multiple sclerosis: Secondary | ICD-10-CM

## 2017-01-31 MED ORDER — CLONAZEPAM 0.5 MG PO TABS: 0.5000 mg | ORAL_TABLET | Freq: Two times a day (BID) | ORAL | 1 refills | Status: DC

## 2017-01-31 MED ORDER — BACLOFEN 10 MG PO TABS: 10.0000 mg | ORAL_TABLET | Freq: Three times a day (TID) | ORAL | 3 refills | Status: DC

## 2017-01-31 NOTE — Progress Notes (Signed)
Reason for visit: Multiple sclerosis  Makayla Wade is an 66 y.o. female  History of present illness:  Makayla Wade is a 66 year old right-handed white female with a history of multiple sclerosis. This patient has done relatively well over the last year. She does have some intermittent tingling in the fingers when she drinks or eats something that his cold. Otherwise, she does not have problems. The patient has some mild gait instability, she has not had any falls. She does have some difficulty with controlling the bladder, she has chronic constipation issues. The patient is on baclofen and clonazepam for low grade spasticity. She has not had any new symptoms since last seen. The patient does have chronic fatigue. She has done well with her migraine headache.  Past Medical History:  Diagnosis Date  . Depression    Hx.  Marland Kitchen Hemorrhoids    Hx.  . Migraine headache   . Multiple sclerosis (HCC)   . Optic neuritis, right   . Seizures (HCC)    Hx.   . Syncope   . Ulcerative colitis [facility])     Past Surgical History:  Procedure Laterality Date  . BREAST LUMPECTOMY    . CESAREAN SECTION    . FOOT SURGERY    . INCONTINENCE SURGERY    . OVARIAN CYST SURGERY      Family History  Problem Relation Age of Onset  . Cancer Mother   . Heart attack Father   . Cancer Father   . Cancer Maternal Grandmother   . Cancer Paternal Grandfather     Social history:  reports that she quit smoking about 48 years ago. She has never used smokeless tobacco. She reports that she drinks about 4.2 oz of alcohol per week . She reports that she does not use drugs.    Allergies  Allergen Reactions  . Sulfa Antibiotics     Funny sensations, rapid heartbeats  . Copper-Containing Compounds     Medications:  Prior to Admission medications   Medication Sig Start Date End Date Taking? Authorizing Provider  alendronate (FOSAMAX) 70 MG tablet Take 70 mg by mouth once a week. Take with a full glass of water  on an empty stomach.   Yes Historical Provider, MD  baclofen (LIORESAL) 10 MG tablet Take 1 tablet (10 mg total) by mouth 3 (three) times daily. 02/01/16  Yes York Spaniel, MD  Calcium Carbonate-Vitamin D (CALTRATE 600+D) 600-400 MG-UNIT per tablet Take 1 tablet by mouth daily.   Yes Historical Provider, MD  cholecalciferol (VITAMIN D) 1000 UNITS tablet Take 1,000 Units by mouth daily.   Yes Historical Provider, MD  clonazePAM (KLONOPIN) 0.5 MG tablet Take 1 tablet (0.5 mg total) by mouth 2 (two) times daily. 08/22/16  Yes York Spaniel, MD  FLUZONE HIGH-DOSE 0.5 ML SUSY  09/30/14  Yes Historical Provider, MD  Multiple Vitamins-Minerals (CENTRUM SILVER ADULT 50+ PO) Take 1 tablet by mouth daily.   Yes Historical Provider, MD    ROS:  Out of a complete 14 system review of symptoms, the patient complains only of the following symptoms, and all other reviewed systems are negative.  Eye itching Constipation Frequency of urination  Blood pressure (!) 142/85, pulse 77, height 4\' 11"  (1.499 m), weight 119 lb 8 oz (54.2 kg).  Physical Exam  General: The patient is alert and cooperative at the time of the examination.  Skin: No significant peripheral edema is noted.   Neurologic Exam  Mental status: The  patient is alert and oriented x 3 at the time of the examination. The patient has apparent normal recent and remote memory, with an apparently normal attention span and concentration ability.   Cranial nerves: Facial symmetry is present. Speech is normal, no aphasia or dysarthria is noted. Extraocular movements are full. Visual fields are full. Pupils are equal, round, and reactive to light. Discs are flat bilaterally.  Motor: The patient has good strength in all 4 extremities.  Sensory examination: Soft touch sensation is symmetric on the face, arms, and legs.  Coordination: The patient has good finger-nose-finger and heel-to-shin bilaterally.  Gait and station: The patient has a  normal gait. Tandem gait is normal. Romberg is negative. No drift is seen.  Reflexes: Deep tendon reflexes are symmetric.   Assessment/Plan:  1. Multiple sclerosis  2. Migraine headache  The patient appears to be quite stable with her MS. She has not had any new symptoms over the last year, she is not on any medications for the multiple sclerosis as a disease modifying agent. The patient was given prescriptions for the baclofen and clonazepam, she will follow-up in one year, sooner if needed.  Marlan Palau MD 01/31/2017 8:00 AM  [facility] [address] [place], [place] [address]  Phone [phone] Fax [phone]

## 2017-01-31 NOTE — Progress Notes (Signed)
Faxed printed/signed rx clonazepam to pt pharmacy. Fax: [phone]. Received confirmation.

## 2017-08-05 ENCOUNTER — Telehealth: Payer: Self-pay | Admitting: Neurology

## 2017-08-05 MED ORDER — CLONAZEPAM 0.5 MG PO TABS: 0.5000 mg | ORAL_TABLET | Freq: Two times a day (BID) | ORAL | 1 refills | Status: DC

## 2017-08-05 NOTE — Telephone Encounter (Signed)
Printed rx, awaiting signature from [facility], VP,MD.

## 2017-08-05 NOTE — Telephone Encounter (Signed)
Faxed printed/signed rx to pt pharmacy. Fax- [phone]. Received confirmation.

## 2017-08-05 NOTE — Telephone Encounter (Signed)
Pt request refill for clonazePAM (KLONOPIN) 0.5 MG tablet sent to [facility]. Pt said she is out of medication. Pt is aware Dr Anne Hahn is out of the office.

## 2017-08-06 ENCOUNTER — Encounter: Payer: Self-pay | Admitting: Neurology

## 2017-08-28 ENCOUNTER — Telehealth: Payer: Self-pay | Admitting: Neurology

## 2017-08-28 NOTE — Telephone Encounter (Signed)
The fecal immunochemical test (FIT) came back positive. Not sure why this test was sent to our office, I will send on to the primary care physician.

## 2017-09-11 ENCOUNTER — Encounter: Payer: Self-pay | Admitting: Gastroenterology

## 2017-11-01 ENCOUNTER — Other Ambulatory Visit: Payer: Self-pay | Admitting: Diagnostic Neuroimaging

## 2017-11-04 NOTE — Telephone Encounter (Signed)
Fax confirmation received for [facility] [phone] [email or web address] sy

## 2018-01-29 ENCOUNTER — Telehealth: Payer: Self-pay | Admitting: Neurology

## 2018-01-29 ENCOUNTER — Encounter (INDEPENDENT_AMBULATORY_CARE_PROVIDER_SITE_OTHER): Payer: Self-pay

## 2018-01-29 ENCOUNTER — Ambulatory Visit (INDEPENDENT_AMBULATORY_CARE_PROVIDER_SITE_OTHER): Payer: BLUE CROSS/BLUE SHIELD | Admitting: Neurology

## 2018-01-29 ENCOUNTER — Encounter: Payer: Self-pay | Admitting: Neurology

## 2018-01-29 VITALS — BP 117/64 | HR 78 | Wt 121.5 lb

## 2018-01-29 DIAGNOSIS — G35 Multiple sclerosis: Secondary | ICD-10-CM | POA: Diagnosis not present

## 2018-01-29 DIAGNOSIS — G43109 Migraine with aura, not intractable, without status migrainosus: Secondary | ICD-10-CM

## 2018-01-29 DIAGNOSIS — Z5181 Encounter for therapeutic drug level monitoring: Secondary | ICD-10-CM

## 2018-01-29 NOTE — Telephone Encounter (Signed)
Patient states she will call back to schedule her follow-up with Dr. Anne Hahn. She refuses to see a Freight forwarder.

## 2018-01-29 NOTE — Progress Notes (Signed)
Reason for visit: Multiple sclerosis  Makayla Wade is an 67 y.o. female  History of present illness:  Makayla Wade is a 67 year old right-handed white female with a history of multiple sclerosis and a history of migraine headache.  The patient is having more frequent episodes of migraine that are associated with a visual aura and an olfactory sensation of Lavender.  The episodes may last 30-45 minutes and are not usually associated with headache, the patient may feel somewhat spaced out after the event.  The patient does not wish to go on medications for the migraine.  In terms of her MS symptoms, she has not noted any new numbness or weakness, visual changes, bowel or bladder control problems or difficulty with balance since last seen.  She does report some generalized fatigue.  She indicates that she does sleep fairly well.  She is not on any disease modifying agents, she is taking baclofen and clonazepam for low-grade spasticity.  She returns for an evaluation.  Her last MRI of the brain was in 2015.  Past Medical History:  Diagnosis Date  . Depression    Hx.  Marland Kitchen Hemorrhoids    Hx.  . Migraine headache   . Multiple sclerosis (HCC)   . Optic neuritis, right   . Seizures (HCC)    Hx.   . Syncope   . Ulcerative colitis [facility])     Past Surgical History:  Procedure Laterality Date  . BREAST LUMPECTOMY    . CESAREAN SECTION    . FOOT SURGERY    . INCONTINENCE SURGERY    . OVARIAN CYST SURGERY      Family History  Problem Relation Age of Onset  . Cancer Mother   . Heart attack Father   . Cancer Father   . Cancer Maternal Grandmother   . Cancer Paternal Grandfather     Social history:  reports that she quit smoking about 49 years ago. she has never used smokeless tobacco. She reports that she drinks about 4.2 oz of alcohol per week. She reports that she does not use drugs.    Allergies  Allergen Reactions  . Sulfa Antibiotics     Funny sensations, rapid heartbeats  .  Copper-Containing Compounds     Medications:  Prior to Admission medications   Medication Sig Start Date End Date Taking? Authorizing Provider  alendronate (FOSAMAX) 70 MG tablet Take 70 mg by mouth once a week. Take with a full glass of water on an empty stomach.   Yes [provider]  baclofen (LIORESAL) 10 MG tablet Take 1 tablet (10 mg total) by mouth 3 (three) times daily. 01/31/17  Yes York Spaniel, MD  Calcium Carbonate-Vitamin D (CALTRATE 600+D) 600-400 MG-UNIT per tablet Take 1 tablet by mouth daily.   Yes [provider]  cholecalciferol (VITAMIN D) 1000 UNITS tablet Take 1,000 Units by mouth daily.   Yes [provider]  clonazePAM (KLONOPIN) 0.5 MG tablet TAKE ONE TABLET BY MOUTH TWICE DAILY 11/04/17  Yes York Spaniel, MD  Multiple Vitamins-Minerals (CENTRUM SILVER ADULT 50+ PO) Take 1 tablet by mouth daily.   Yes [provider]    ROS:  Out of a complete 14 system review of symptoms, the patient complains only of the following symptoms, and all other reviewed systems are negative.  Visual disturbances, migraine Fatigue  Blood pressure 117/64, pulse 78, weight 121 lb 8 oz (55.1 kg).  Physical Exam  General: The patient is alert and cooperative  at the time of the examination.  Skin: No significant peripheral edema is noted.   Neurologic Exam  Mental status: The patient is alert and oriented x 3 at the time of the examination. The patient has apparent normal recent and remote memory, with an apparently normal attention span and concentration ability.   Cranial nerves: Facial symmetry is present. Speech is normal, no aphasia or dysarthria is noted. Extraocular movements are full. Visual fields are full.  Pupils are equal, round, and reactive to light.  Discs are flat bilaterally.  Motor: The patient has good strength in all 4 extremities.  Sensory examination: Soft touch sensation is symmetric on the face, arms, and  legs.  Coordination: The patient has good finger-nose-finger and heel-to-shin bilaterally.  Gait and station: The patient has a normal gait. Tandem gait is normal. Romberg is negative. No drift is seen.  Reflexes: Deep tendon reflexes are symmetric.   Assessment/Plan:  1.  Migraine headache, visual aura  2.  Multiple sclerosis  The patient clinically is doing quite well, her examination today is essentially normal.  She is not on any disease modifying agents, the last MRI of the brain was done in 2015.  We will check blood work today, she will have MRI of the brain to follow-up.  The patient will come back in 1 year, sooner if needed.   Marlan Palau MD 01/29/2018 8:26 AM  [facility] [address] [place], [place] [address]  Phone [phone] Fax [phone]

## 2018-01-29 NOTE — Telephone Encounter (Signed)
Patient refuses to see NP for 1 year follow-up. She states she will call us back to schedule her yearly follow-up.

## 2018-01-30 ENCOUNTER — Telehealth: Payer: Self-pay | Admitting: Neurology

## 2018-01-30 ENCOUNTER — Telehealth: Payer: Self-pay

## 2018-01-30 LAB — COMPREHENSIVE METABOLIC PANEL
ALBUMIN: 4.3 g/dL (ref 3.6–4.8)
ALT: 16 IU/L (ref 0–32)
AST: 21 IU/L (ref 0–40)
Albumin/Globulin Ratio: 1.9 (ref 1.2–2.2)
Alkaline Phosphatase: 72 IU/L (ref 39–117)
BILIRUBIN TOTAL: 0.3 mg/dL (ref 0.0–1.2)
BUN/Creatinine Ratio: 17 (ref 12–28)
BUN: 11 mg/dL (ref 8–27)
CALCIUM: 9.8 mg/dL (ref 8.7–10.3)
CHLORIDE: 104 mmol/L (ref 96–106)
CO2: 24 mmol/L (ref 20–29)
Creatinine, Ser: 0.66 mg/dL (ref 0.57–1.00)
GFR calc non Af Amer: 91 mL/min/{1.73_m2} (ref >59)
GFR, EST AFRICAN AMERICAN: 105 mL/min/{1.73_m2} (ref >59)
Globulin, Total: 2.3 g/dL (ref 1.5–4.5)
Glucose: 80 mg/dL (ref 65–99)
Potassium: 4.6 mmol/L (ref 3.5–5.2)
Sodium: 144 mmol/L (ref 134–144)
TOTAL PROTEIN: 6.6 g/dL (ref 6.0–8.5)

## 2018-01-30 NOTE — Telephone Encounter (Signed)
Pt called just wanting to let Dr. Anne Hahn know that she had 3 migraines yesterday

## 2018-01-30 NOTE — Telephone Encounter (Signed)
I called the patient.  The patient has had 3 migraine type episodes yesterday she did have a headache with 1 of the events.  If she decides that she wants to go on medication for migraine, she will let me know.  We will discuss this when I called her about the MRI study.

## 2018-01-30 NOTE — Telephone Encounter (Signed)
Notes recorded by York Spaniel, MD on 01/30/2018 at 7:47 AM EST The blood work results are unremarkable  I called and left a detailed message with lab results. I advised her to contact us if she has any questions or concerns.

## 2018-02-05 ENCOUNTER — Other Ambulatory Visit: Payer: Self-pay | Admitting: Neurology

## 2018-02-08 ENCOUNTER — Ambulatory Visit
Admission: RE | Admit: 2018-02-08 | Discharge: 2018-02-08 | Disposition: A | Discharge status: Home / Self Care | Payer: BLUE CROSS/BLUE SHIELD | Source: Ambulatory Visit | Attending: Neurology | Admitting: Neurology

## 2018-02-08 ENCOUNTER — Telehealth: Payer: Self-pay | Admitting: Neurology

## 2018-02-08 DIAGNOSIS — G35 Multiple sclerosis: Secondary | ICD-10-CM | POA: Diagnosis not present

## 2018-02-08 MED ORDER — GADOBENATE DIMEGLUMINE 529 MG/ML IV SOLN
10.0000 mL | Freq: Once | INTRAVENOUS | Status: AC | PRN
  Administered 2018-02-08: 10 mL via INTRAVENOUS

## 2018-02-08 NOTE — Telephone Encounter (Signed)
I called the patient.  MRI of the brain shows no change from 2015.  The patient is not on any disease modifying agents at this time.

## 2018-02-08 NOTE — Telephone Encounter (Signed)
     MRI brain 02/06/18:  IMPRESSION: Abnormal MRI scan of the brain showing multiple supratentorial subcortical and periventricular white matter hyperintensities consistent with diagnosis of multiple sclerosis.  No enhancing lesions are noted.  The presence of T1 black holes indicates chronic disease.  No significant change compared with previous MRI scan dated 05/19/2014 allowing for minor differences between the technical aspects of the 2 scans except for increased changes of chronic paranasal sinusitis especially in the right maxillary sinus.

## 2018-05-07 ENCOUNTER — Other Ambulatory Visit: Payer: Self-pay | Admitting: *Deleted

## 2018-05-07 MED ORDER — CLONAZEPAM 0.5 MG PO TABS: 0.5000 mg | ORAL_TABLET | Freq: Two times a day (BID) | ORAL | 1 refills | Status: DC

## 2018-05-07 NOTE — Progress Notes (Signed)
The prescription for clonazepam was refilled.

## 2018-08-06 ENCOUNTER — Other Ambulatory Visit: Payer: Self-pay | Admitting: Neurology

## 2018-11-26 ENCOUNTER — Other Ambulatory Visit: Payer: Self-pay | Admitting: Neurology

## 2019-01-25 ENCOUNTER — Telehealth: Payer: Self-pay | Admitting: Neurology

## 2019-01-25 NOTE — Telephone Encounter (Signed)
I called the patient.  I have worked in slots on my schedule that I can see her sooner, she should be seen soon anyway, it has been over a year since her last revisit.  I will have my nurse call her for a work in appointment.

## 2019-01-25 NOTE — Telephone Encounter (Signed)
Called patient today to reschedule her July 6th appointment due to Dr. Anne Hahn being on vacation. I informed her that the appointment would only be pushed out a week. Patient became combative and stated that she was irritated for being pushed out to July, because she was supposed to be seen in February. This follow-up was scheduled at her last appointment in February of 2019.  I asked her why she was pushed out and she stated it is because she does not want to see a nurse practitioner. I informed patient that we are more than willing to work her in if she is not feeling well, and I informed her that I was going to place her on hold to see if there was any sooner availability for Dr. Anne Hahn to accommodate her. After doing this, I informed her that there were no open spots for Dr. Anne Hahn but there were plenty if she wanted to see a nurse practitioner. She then became irate and stated that our office staff are "the rudest and nastiest" that she had ever dealt with. I sincerely apologized to her for this situation and she stated that she "is not sure" if I am sorry. She then proceeded to interrupt me after I tried to resolve the situation, told me to give her the appointment that I offered, and hung up before I could say anything else.

## 2019-01-26 NOTE — Telephone Encounter (Signed)
I contacted the pt and lvm advising Dr. Anne Hahn had an opening become available on 01/28/19 at 3 pm with a check in time of 2:30 pm. Pt advised to call back if this appt would be agreeable.

## 2019-01-26 NOTE — Telephone Encounter (Signed)
I contacted the pt and lvm to return call about f/u appt.

## 2019-05-14 ENCOUNTER — Other Ambulatory Visit: Payer: Self-pay

## 2019-05-15 LAB — CMP12+LP+TP+TSH+6AC+CBC/D/PLT
ALT: 12 IU/L (ref 0–32)
AST: 17 IU/L (ref 0–40)
Albumin/Globulin Ratio: 2.2 (ref 1.2–2.2)
Albumin: 4.1 g/dL (ref 3.8–4.8)
Alkaline Phosphatase: 69 IU/L (ref 39–117)
BUN/Creatinine Ratio: 29 — ABNORMAL HIGH (ref 12–28)
BUN: 14 mg/dL (ref 8–27)
Basophils Absolute: 0 10*3/uL (ref 0.0–0.2)
Basos: 1 %
Bilirubin Total: 0.3 mg/dL (ref 0.0–1.2)
Calcium: 9.1 mg/dL (ref 8.7–10.3)
Chloride: 101 mmol/L (ref 96–106)
Chol/HDL Ratio: 2.5 ratio (ref 0.0–4.4)
Cholesterol, Total: 172 mg/dL (ref 100–199)
Creatinine, Ser: 0.49 mg/dL — ABNORMAL LOW (ref 0.57–1.00)
EOS (ABSOLUTE): 0.2 10*3/uL (ref 0.0–0.4)
Eos: 4 %
Estimated CHD Risk: <0.5 times avg. (ref 0.0–1.0)
Free Thyroxine Index: 1.8 (ref 1.2–4.9)
GFR calc Af Amer: 114 mL/min/{1.73_m2} (ref >59)
GFR calc non Af Amer: 99 mL/min/{1.73_m2} (ref >59)
GGT: 20 IU/L (ref 0–60)
Globulin, Total: 1.9 g/dL (ref 1.5–4.5)
Glucose: 85 mg/dL (ref 65–99)
HDL: 68 mg/dL (ref >39)
Hematocrit: 39.4 % (ref 34.0–46.6)
Hemoglobin: 13.7 g/dL (ref 11.1–15.9)
Immature Grans (Abs): 0 10*3/uL (ref 0.0–0.1)
Immature Granulocytes: 0 %
Iron: 89 ug/dL (ref 27–139)
LDH: 159 IU/L (ref 119–226)
LDL Calculated: 88 mg/dL (ref 0–99)
Lymphocytes Absolute: 1.4 10*3/uL (ref 0.7–3.1)
Lymphs: 28 %
MCH: 33.7 pg — ABNORMAL HIGH (ref 26.6–33.0)
MCHC: 34.8 g/dL (ref 31.5–35.7)
MCV: 97 fL (ref 79–97)
Monocytes Absolute: 0.4 10*3/uL (ref 0.1–0.9)
Monocytes: 9 %
Neutrophils Absolute: 2.8 10*3/uL (ref 1.4–7.0)
Neutrophils: 58 %
Phosphorus: 3.2 mg/dL (ref 3.0–4.3)
Platelets: 204 10*3/uL (ref 150–450)
Potassium: 4.1 mmol/L (ref 3.5–5.2)
RBC: 4.06 x10E6/uL (ref 3.77–5.28)
RDW: 12.7 % (ref 11.7–15.4)
Sodium: 137 mmol/L (ref 134–144)
T3 Uptake Ratio: 26 % (ref 24–39)
T4, Total: 6.8 ug/dL (ref 4.5–12.0)
TSH: 2.36 u[IU]/mL (ref 0.450–4.500)
Total Protein: 6 g/dL (ref 6.0–8.5)
Triglycerides: 78 mg/dL (ref 0–149)
Uric Acid: 3.4 mg/dL (ref 2.5–7.1)
VLDL Cholesterol Cal: 16 mg/dL (ref 5–40)
WBC: 4.9 10*3/uL (ref 3.4–10.8)

## 2019-06-14 ENCOUNTER — Ambulatory Visit: Payer: Medicare Other | Admitting: Neurology

## 2019-06-24 ENCOUNTER — Encounter: Payer: Self-pay | Admitting: Neurology

## 2019-06-24 ENCOUNTER — Ambulatory Visit (INDEPENDENT_AMBULATORY_CARE_PROVIDER_SITE_OTHER): Payer: Medicare Other | Admitting: Neurology

## 2019-06-24 ENCOUNTER — Other Ambulatory Visit: Payer: Self-pay

## 2019-06-24 VITALS — BP 125/75 | HR 83 | Temp 97.3°F | Ht 59.0 in | Wt 122.3 lb

## 2019-06-24 DIAGNOSIS — G43109 Migraine with aura, not intractable, without status migrainosus: Secondary | ICD-10-CM

## 2019-06-24 DIAGNOSIS — G35 Multiple sclerosis: Secondary | ICD-10-CM | POA: Diagnosis not present

## 2019-06-24 MED ORDER — BACLOFEN 10 MG PO TABS: 10.0000 mg | ORAL_TABLET | Freq: Two times a day (BID) | ORAL | 3 refills | Status: DC

## 2019-06-24 MED ORDER — CLONAZEPAM 0.5 MG PO TABS: 0.5000 mg | ORAL_TABLET | Freq: Two times a day (BID) | ORAL | 1 refills | Status: DC

## 2019-06-24 NOTE — Progress Notes (Signed)
Reason for visit: Multiple sclerosis  Makayla Wade is an 68 y.o. female  History of present illness:  Makayla Wade is a 68 year old right-handed white female with a history of multiple sclerosis, she has been stable over the last several years and she is not on any disease modifying agents.  She does have a history of migraine and migraine aura that occurs off and on, she has not wanted to go on any medications for her headaches.  She indicates that flashing lights and weather changes are the major activators for her events.  The patient has not had any new issues since last seen with numbness, weakness, balance changes, or difficulty controlling the bowels or the bladder.  She is on low-dose baclofen taking 10 mg twice daily.  She denies any falls.  She returns for an evaluation.  She continues to work.  Past Medical History:  Diagnosis Date  . Depression    Hx.  Marland Kitchen Hemorrhoids    Hx.  . Migraine headache   . Multiple sclerosis ([place])   . Optic neuritis, right   . Seizures ([place])    Hx.   . Syncope   . Ulcerative colitis [facility])     Past Surgical History:  Procedure Laterality Date  . BREAST LUMPECTOMY    . CESAREAN SECTION    . FOOT SURGERY    . INCONTINENCE SURGERY    . OVARIAN CYST SURGERY      Family History  Problem Relation Age of Onset  . Cancer Mother   . Heart attack Father   . Cancer Father   . Cancer Maternal Grandmother   . Cancer Paternal Grandfather     Social history:  reports that she quit smoking about 50 years ago. She has never used smokeless tobacco. She reports current alcohol use of about 7.0 standard drinks of alcohol per week. She reports that she does not use drugs.    Allergies  Allergen Reactions  . Sulfa Antibiotics     Funny sensations, rapid heartbeats  . Copper-Containing Compounds     Medications:  Prior to Admission medications   Medication Sig Start Date End Date Taking? Authorizing Provider  alendronate (FOSAMAX) 70 MG tablet  Take 70 mg by mouth once a week. Take with a full glass of water on an empty stomach.   Yes [provider]  baclofen (LIORESAL) 10 MG tablet TAKE 1 TABLET BY MOUTH 3 TIMES DAILY 02/06/18  Yes Kathrynn Ducking, MD  Calcium Carbonate-Vitamin D (CALTRATE 600+D) 600-400 MG-UNIT per tablet Take 1 tablet by mouth daily.   Yes [provider]  cholecalciferol (VITAMIN D) 1000 UNITS tablet Take 1,000 Units by mouth daily.   Yes [provider]  clonazePAM (KLONOPIN) 0.5 MG tablet TAKE 1 TABLET BY MOUTH TWICE DAILY 11/27/18  Yes Kathrynn Ducking, MD  Multiple Vitamins-Minerals (CENTRUM SILVER ADULT 50+ PO) Take 1 tablet by mouth daily.   Yes [provider]    ROS:  Out of a complete 14 system review of symptoms, the patient complains only of the following symptoms, and all other reviewed systems are negative.  Headache   Blood pressure 125/75, pulse 83, temperature (!) 97.3 F (36.3 C), temperature source Temporal, height 4\' 11"  (1.499 m), weight 122 lb 5 oz (55.5 kg).  Physical Exam  General: The patient is alert and cooperative at the time of the examination.  Skin: No significant peripheral edema is noted.   Neurologic Exam  Mental status: The  patient is alert and oriented x 3 at the time of the examination. The patient has apparent normal recent and remote memory, with an apparently normal attention span and concentration ability.   Cranial nerves: Facial symmetry is present. Speech is normal, no aphasia or dysarthria is noted. Extraocular movements are full. Visual fields are full.  Motor: The patient has good strength in all 4 extremities.  Sensory examination: Soft touch sensation is symmetric on the face, arms, and legs.  Coordination: The patient has good finger-nose-finger and heel-to-shin bilaterally.  Gait and station: The patient has a normal gait. Tandem gait is minimally unsteady. Romberg is negative. No drift is seen.  Reflexes:  Deep tendon reflexes are symmetric.   MRI brain 02/06/18:  IMPRESSION: Abnormal MRI scan of the brain showing multiple supratentorial subcortical and periventricular white matter hyperintensities consistent with diagnosis of multiple sclerosis. No enhancing lesions are noted. The presence of T1 black holes indicates chronic disease. No significant change compared with previous MRI scan dated 05/19/2014 allowing for minor differences between the technical aspects of the 2 scans except for increased changes of chronic paranasal sinusitis especially in the right maxillary sinus.  * MRI scan images were reviewed online. I agree with the written report.   Assessment/Plan:  1.  Multiple sclerosis  2.  Migraine headache, migraine equivalent  The patient is not on medical therapy for her migraine or MS, she remained stable in this regard, we will continue to follow the patient over time, prescriptions for baclofen and clonazepam were sent in.  She will follow-up in 1 year.  Greater than 50% of the visit was spent in counseling and coordination of care.  Face-to-face time with the patient was 25 minutes.   Marlan Palau MD 06/24/2019 8:11 AM  [facility] [address] [place], [place] [address]  Phone [phone] Fax [phone]

## 2019-12-16 DIAGNOSIS — D485 Neoplasm of uncertain behavior of skin: Secondary | ICD-10-CM | POA: Diagnosis not present

## 2019-12-16 DIAGNOSIS — D0371 Melanoma in situ of right lower limb, including hip: Secondary | ICD-10-CM | POA: Diagnosis not present

## 2019-12-16 DIAGNOSIS — D1801 Hemangioma of skin and subcutaneous tissue: Secondary | ICD-10-CM | POA: Diagnosis not present

## 2019-12-16 DIAGNOSIS — Z872 Personal history of diseases of the skin and subcutaneous tissue: Secondary | ICD-10-CM | POA: Diagnosis not present

## 2019-12-21 ENCOUNTER — Telehealth: Payer: Self-pay | Admitting: Neurology

## 2019-12-21 NOTE — Telephone Encounter (Signed)
Per vo by Dr. Anne Hahn, there is no contraindication with her getting the COVID-19 vaccination.  I returned the call to the patient and she verbalized understanding of this information.

## 2019-12-21 NOTE — Telephone Encounter (Signed)
Patient is calling in wanting to know if it is safe for her to take the COVID vaccine

## 2019-12-22 DIAGNOSIS — D0371 Melanoma in situ of right lower limb, including hip: Secondary | ICD-10-CM | POA: Diagnosis not present

## 2020-03-17 ENCOUNTER — Other Ambulatory Visit: Payer: Self-pay | Admitting: Neurology

## 2020-04-11 ENCOUNTER — Ambulatory Visit: Payer: 59 | Admitting: Emergency Medicine

## 2020-04-11 ENCOUNTER — Encounter: Payer: Self-pay | Admitting: Emergency Medicine

## 2020-04-11 ENCOUNTER — Other Ambulatory Visit: Payer: Self-pay

## 2020-04-11 VITALS — BP 140/76 | HR 74 | Temp 98.2°F | Resp 16 | Ht 59.0 in | Wt 124.0 lb

## 2020-04-11 DIAGNOSIS — G43109 Migraine with aura, not intractable, without status migrainosus: Secondary | ICD-10-CM

## 2020-04-11 NOTE — Progress Notes (Signed)
Yesterday while working was having a headaches & head was dropping down. Calls it an eye migraine - usually affects her right eye yesterday & yesterday it affected her two times in a row.  Anxious about having to take blood pressure medicine.  All the people she knows have had bad experiences - mother & grandmother.  AMD

## 2020-04-11 NOTE — Progress Notes (Signed)
   Subjective: Recent headache and daytime sleepiness    Patient ID: Makayla Wade, female    DOB: [birth year], 69 y.o.   MRN: [id number]  HPI Patient reports yesterday while working she was having headaches with a history of ocular migraines.  She typically has visual distortion with headache and weakness.  It affected her right eye yesterday and yesterday it affect her twice in a row.  She thinks this may have been weather related.  She denies any other complaints right now, feels back to normal.  Has no visual distortion.   Review of Systems Positive for recent headaches, vision changes.  Negative for fevers, chills, chest pain, shortness of breath, vomiting or diarrhea.    Objective:   Physical Exam General: No acute distress HEENT exam pupils equal round reactive to light, extraocular movements are intact, mucous membranes are moist, no adenopathy Cardiovascular: Regular rate and rhythm, normal S1-S2 Pulmonary: Lungs clear to auscultation bilaterally Abdomen: Soft, nontender Neurologic: Normal gait, strength, sensation, cranial nerves are normal Extremities: Unremarkable range of motion       Assessment & Plan: Ocular migraines  Patient recently with ocular migraines and symptoms secondary to same.  She is in no acute distress with normal neurologic exam.  She does not take any medications for this and is not interested in taking anything.  She is cleared for follow-up as needed.  She may return to work today.

## 2020-05-03 DIAGNOSIS — M810 Age-related osteoporosis without current pathological fracture: Secondary | ICD-10-CM | POA: Diagnosis not present

## 2020-05-03 DIAGNOSIS — M8588 Other specified disorders of bone density and structure, other site: Secondary | ICD-10-CM | POA: Diagnosis not present

## 2020-05-03 DIAGNOSIS — Z1231 Encounter for screening mammogram for malignant neoplasm of breast: Secondary | ICD-10-CM | POA: Diagnosis not present

## 2020-05-03 LAB — HM DEXA SCAN

## 2020-05-03 NOTE — Progress Notes (Signed)
Scheduled to complete physical 05/11/20.  (Provider TBD)  AMD 

## 2020-05-04 ENCOUNTER — Other Ambulatory Visit: Payer: Self-pay

## 2020-05-04 ENCOUNTER — Ambulatory Visit: Payer: Self-pay

## 2020-05-04 DIAGNOSIS — R82998 Other abnormal findings in urine: Secondary | ICD-10-CM

## 2020-05-04 DIAGNOSIS — Z0000 Encounter for general adult medical examination without abnormal findings: Secondary | ICD-10-CM

## 2020-05-04 LAB — POCT URINALYSIS DIPSTICK
Bilirubin, UA: NEGATIVE
Blood, UA: NEGATIVE
Glucose, UA: NEGATIVE
Ketones, UA: NEGATIVE
Nitrite, UA: NEGATIVE
Protein, UA: NEGATIVE
Spec Grav, UA: 1.015 (ref 1.010–1.025)
Urobilinogen, UA: 0.2 E.U./dL
pH, UA: 6 (ref 5.0–8.0)

## 2020-05-06 LAB — CMP12+LP+TP+TSH+6AC+CBC/D/PLT
ALT: 18 IU/L (ref 0–32)
AST: 28 IU/L (ref 0–40)
Albumin/Globulin Ratio: 2 (ref 1.2–2.2)
Albumin: 4.3 g/dL (ref 3.7–4.7)
Alkaline Phosphatase: 74 IU/L (ref 48–121)
BUN/Creatinine Ratio: 26 (ref 12–28)
BUN: 16 mg/dL (ref 8–27)
Basophils Absolute: 0 10*3/uL (ref 0.0–0.2)
Basos: 1 %
Bilirubin Total: 0.5 mg/dL (ref 0.0–1.2)
Calcium: 9.6 mg/dL (ref 8.7–10.3)
Chloride: 103 mmol/L (ref 96–106)
Chol/HDL Ratio: 3.1 ratio (ref 0.0–4.4)
Cholesterol, Total: 197 mg/dL (ref 100–199)
Creatinine, Ser: 0.61 mg/dL (ref 0.57–1.00)
EOS (ABSOLUTE): 0.1 10*3/uL (ref 0.0–0.4)
Eos: 2 %
Estimated CHD Risk: <0.5 times avg. (ref 0.0–1.0)
Free Thyroxine Index: 2 (ref 1.2–4.9)
GFR calc Af Amer: 105 mL/min/{1.73_m2} (ref >59)
GFR calc non Af Amer: 92 mL/min/{1.73_m2} (ref >59)
GGT: 22 IU/L (ref 0–60)
Globulin, Total: 2.2 g/dL (ref 1.5–4.5)
Glucose: 83 mg/dL (ref 65–99)
HDL: 63 mg/dL (ref >39)
Hematocrit: 42.9 % (ref 34.0–46.6)
Hemoglobin: 14.8 g/dL (ref 11.1–15.9)
Immature Grans (Abs): 0 10*3/uL (ref 0.0–0.1)
Immature Granulocytes: 1 %
Iron: 121 ug/dL (ref 27–139)
LDH: 186 IU/L (ref 119–226)
LDL Chol Calc (NIH): 117 mg/dL — ABNORMAL HIGH (ref 0–99)
Lymphocytes Absolute: 1.3 10*3/uL (ref 0.7–3.1)
Lymphs: 31 %
MCH: 33.3 pg — ABNORMAL HIGH (ref 26.6–33.0)
MCHC: 34.5 g/dL (ref 31.5–35.7)
MCV: 97 fL (ref 79–97)
Monocytes Absolute: 0.4 10*3/uL (ref 0.1–0.9)
Monocytes: 9 %
Neutrophils Absolute: 2.5 10*3/uL (ref 1.4–7.0)
Neutrophils: 56 %
Phosphorus: 3.6 mg/dL (ref 3.0–4.3)
Platelets: 182 10*3/uL (ref 150–450)
Potassium: 4.1 mmol/L (ref 3.5–5.2)
RBC: 4.44 x10E6/uL (ref 3.77–5.28)
RDW: 12.6 % (ref 11.7–15.4)
Sodium: 139 mmol/L (ref 134–144)
T3 Uptake Ratio: 28 % (ref 24–39)
T4, Total: 7.3 ug/dL (ref 4.5–12.0)
TSH: 1.88 u[IU]/mL (ref 0.450–4.500)
Total Protein: 6.5 g/dL (ref 6.0–8.5)
Triglycerides: 95 mg/dL (ref 0–149)
Uric Acid: 3.7 mg/dL (ref 3.1–7.9)
VLDL Cholesterol Cal: 17 mg/dL (ref 5–40)
WBC: 4.3 10*3/uL (ref 3.4–10.8)

## 2020-05-06 LAB — URINE CULTURE

## 2020-05-11 ENCOUNTER — Other Ambulatory Visit: Payer: Self-pay

## 2020-05-11 ENCOUNTER — Ambulatory Visit: Payer: Self-pay | Admitting: Physician Assistant

## 2020-05-11 ENCOUNTER — Encounter: Payer: Self-pay | Admitting: Physician Assistant

## 2020-05-11 VITALS — BP 136/90 | HR 72 | Temp 97.7°F | Resp 12 | Ht 59.0 in | Wt 122.0 lb

## 2020-05-11 DIAGNOSIS — Z0000 Encounter for general adult medical examination without abnormal findings: Secondary | ICD-10-CM

## 2020-05-11 NOTE — Progress Notes (Signed)
° °  Subjective: Annual exam    Patient ID: Makayla Wade, female    DOB: 05-26-1951, 69 y.o.   MRN: [id number]  HPI Patient presents for annual exam.  Patient voiced no concerns or complaints.   Review of Systems    Anxiety and multiple sclerosis Objective:   Physical Exam No acute distress.  HEENT unremarkable.  Neck is supple for adenopathy or bruits.  Lungs clear to auscultation.  Heart regular rate and rhythm.  Abdomen negative HSM, normoactive bowel sounds, soft, nontender to palpation.  No obvious deformity to upper or lower extremities.  Patient has full digit range of motion upper lower extremities.  No obvious deformity to cervical lumbar spine.  Patient has full neck range of motion cervical lumbar spine.  Cranial nerves II through XII grossly intact.  No acute findings on EKG but will reviewed by supervising doctor.       Assessment & Plan: Well exam  Advised patient continue previous medication.  Discussed lab results with no acute findings.  Follow-up as necessary.

## 2020-07-10 ENCOUNTER — Ambulatory Visit (INDEPENDENT_AMBULATORY_CARE_PROVIDER_SITE_OTHER): Payer: 59 | Admitting: Neurology

## 2020-07-10 ENCOUNTER — Encounter: Payer: Self-pay | Admitting: Neurology

## 2020-07-10 VITALS — BP 136/83 | HR 73 | Ht 59.5 in | Wt 122.0 lb

## 2020-07-10 DIAGNOSIS — G43109 Migraine with aura, not intractable, without status migrainosus: Secondary | ICD-10-CM | POA: Diagnosis not present

## 2020-07-10 DIAGNOSIS — G35 Multiple sclerosis: Secondary | ICD-10-CM | POA: Diagnosis not present

## 2020-07-10 NOTE — Progress Notes (Signed)
Reason for visit: Multiple sclerosis, migraine  Makayla Wade is an 69 y.o. female  History of present illness:  Makayla Wade is a 69 year old right-handed white female with a history of multiple sclerosis.  She is not on any disease modifying agents, her last MRI of the brain was 2 years ago.  The patient has migraine headaches, her most significant activator is a strobe effect with light or bright glare.  The patient generally gets visual aura, the actual headache is not significant.  The patient reports no new symptoms with her multiple sclerosis, she continues to have some urinary urgency and frequency.  She has had a melanoma resected from her right thigh.  She continues to work, she finds that she has more difficulty with multitasking at times.  She returns the office today for an evaluation.   Past Medical History:  Diagnosis Date  . Anxiety   . Depression    Hx.  . Elevated blood pressure reading   . Hemorrhoids    Hx.  . Migraine headache   . Multiple sclerosis (HCC)   . Optic neuritis, right   . Osteoporosis   . Seizures (HCC)    Hx.   . Syncope   . Ulcerative colitis [facility])     Past Surgical History:  Procedure Laterality Date  . BREAST LUMPECTOMY    . CESAREAN SECTION    . FOOT SURGERY    . INCONTINENCE SURGERY    . OVARIAN CYST SURGERY      Family History  Problem Relation Age of Onset  . Cancer Mother   . Heart attack Father   . Cancer Father   . Cancer Maternal Grandmother   . Cancer Paternal Grandfather     Social history:  reports that she quit smoking about 51 years ago. She has never used smokeless tobacco. She reports current alcohol use of about 14.0 standard drinks of alcohol per week. She reports that she does not use drugs.    Allergies  Allergen Reactions  . Sulfa Antibiotics     Funny sensations, rapid heartbeats  . Copper-Containing Compounds     Medications:  Prior to Admission medications   Medication Sig Start Date End Date  Taking? Authorizing Provider  alendronate (FOSAMAX) 70 MG tablet Take 70 mg by mouth once a week. Take with a full glass of water on an empty stomach.   Yes [provider]  baclofen (LIORESAL) 10 MG tablet Take 1 tablet (10 mg total) by mouth 2 (two) times daily. 06/24/19  Yes York Spaniel, MD  Calcium Carbonate-Vitamin D (CALTRATE 600+D) 600-400 MG-UNIT per tablet Take 1 tablet by mouth daily.   Yes [provider]  cholecalciferol (VITAMIN D) 1000 UNITS tablet Take 1,000 Units by mouth daily.   Yes [provider]  clonazePAM (KLONOPIN) 0.5 MG tablet TAKE ONE TABLET BY MOUTH TWICE DAILY 03/20/20  Yes York Spaniel, MD  meloxicam (MOBIC) 15 MG tablet Take 15 mg by mouth daily as needed.  04/20/20  Yes [provider]  Multiple Vitamins-Minerals (CENTRUM SILVER ADULT 50+ PO) Take 1 tablet by mouth daily.   Yes [provider]    ROS:  Out of a complete 14 system review of symptoms, the patient complains only of the following symptoms, and all other reviewed systems are negative.  Headache Mild balance problems  Blood pressure (!) 136/83, pulse 73, height 4' 11.5" (1.511 m), weight 122 lb (55.3 kg).  Physical Exam  General: The  patient is alert and cooperative at the time of the examination.  Skin: No significant peripheral edema is noted.   Neurologic Exam  Mental status: The patient is alert and oriented x 3 at the time of the examination. The patient has apparent normal recent and remote memory, with an apparently normal attention span and concentration ability.   Cranial nerves: Facial symmetry is present. Speech is normal, no aphasia or dysarthria is noted. Extraocular movements are full. Visual fields are full.  Pulls are equal, round, and reactive to light.  Discs are flat bilaterally.  Motor: The patient has good strength in all 4 extremities.  Sensory examination: Soft touch sensation is symmetric on the face, arms, and  legs.  Coordination: The patient has good finger-nose-finger and heel-to-shin bilaterally.  Gait and station: The patient has a normal gait. Tandem gait is slightly unsteady. Romberg is negative. No drift is seen.  Reflexes: Deep tendon reflexes are symmetric.   Assessment/Plan:  1.  Multiple sclerosis  2.  Migraine headache  The patient appears to be neurologically stable, we will consider a repeat MRI of the brain on her next visit, she will follow-up here in 1 year, sooner if needed.  Greater than 50% of the visit was spent in counseling and coordination of care.  Face-to-face time with the patient was 20 minutes.   Marlan Palau MD 07/10/2020 8:01 AM  [facility] [address] [place], [place] [address]  Phone [phone] Fax [phone]

## 2020-07-18 DIAGNOSIS — G43109 Migraine with aura, not intractable, without status migrainosus: Secondary | ICD-10-CM | POA: Diagnosis not present

## 2020-07-28 ENCOUNTER — Other Ambulatory Visit: Payer: Self-pay | Admitting: Neurology

## 2020-09-06 DIAGNOSIS — N3001 Acute cystitis with hematuria: Secondary | ICD-10-CM | POA: Diagnosis not present

## 2020-09-06 DIAGNOSIS — R300 Dysuria: Secondary | ICD-10-CM | POA: Diagnosis not present

## 2020-09-15 ENCOUNTER — Ambulatory Visit: Payer: Self-pay | Admitting: Physician Assistant

## 2020-09-15 ENCOUNTER — Other Ambulatory Visit: Payer: Self-pay

## 2020-09-15 VITALS — BP 136/80 | HR 79 | Temp 97.8°F | Resp 14 | Ht 59.0 in | Wt 122.0 lb

## 2020-09-15 DIAGNOSIS — B370 Candidal stomatitis: Secondary | ICD-10-CM

## 2020-09-15 MED ORDER — FLUCONAZOLE 150 MG PO TABS
150.0000 mg | ORAL_TABLET | Freq: Once | ORAL | 0 refills | Status: AC
Start: 2020-09-15 — End: 2020-09-15

## 2020-09-15 NOTE — Progress Notes (Signed)
Pt states at the corners of her mouth she's possibly getting yeast from taking a medication for a severe UTI.

## 2020-09-15 NOTE — Progress Notes (Signed)
   Subjective: Oral candidiasis    Patient ID: Makayla Wade, female    DOB: [birth year], 69 y.o.   MRN: [id number]  HPI Patient presents with what is oral lesions status post taking Macrobid for UTI.  Patient is also having cracking and peeling to the bilateral upper lips.  Patient also state mild vaginal itching.   Review of Systems    Anxiety Objective:   Physical Exam No acute distress.Visable oral lesions.  Deferred vaginal exam.       Assessment & Plan: oral candidiasis  Advised to continue Neosporin to the upper lip and take Diflucan as directed.  Follow-up in 5 days if no improvement.

## 2020-10-20 DIAGNOSIS — D2271 Melanocytic nevi of right lower limb, including hip: Secondary | ICD-10-CM | POA: Diagnosis not present

## 2020-10-20 DIAGNOSIS — D225 Melanocytic nevi of trunk: Secondary | ICD-10-CM | POA: Diagnosis not present

## 2020-10-20 DIAGNOSIS — D2272 Melanocytic nevi of left lower limb, including hip: Secondary | ICD-10-CM | POA: Diagnosis not present

## 2020-10-20 DIAGNOSIS — L538 Other specified erythematous conditions: Secondary | ICD-10-CM | POA: Diagnosis not present

## 2020-10-20 DIAGNOSIS — L298 Other pruritus: Secondary | ICD-10-CM | POA: Diagnosis not present

## 2020-10-20 DIAGNOSIS — Z85820 Personal history of malignant melanoma of skin: Secondary | ICD-10-CM | POA: Diagnosis not present

## 2020-10-20 DIAGNOSIS — L820 Inflamed seborrheic keratosis: Secondary | ICD-10-CM | POA: Diagnosis not present

## 2020-10-20 DIAGNOSIS — D2261 Melanocytic nevi of right upper limb, including shoulder: Secondary | ICD-10-CM | POA: Diagnosis not present

## 2020-12-07 ENCOUNTER — Other Ambulatory Visit: Payer: Self-pay | Admitting: Neurology

## 2021-02-20 ENCOUNTER — Telehealth: Payer: Self-pay | Admitting: Neurology

## 2021-02-20 NOTE — Telephone Encounter (Signed)
LM for return call

## 2021-02-20 NOTE — Telephone Encounter (Signed)
Pt. states that she is having numbness in left hand & headaches. I offered to schedule her with NP, but she stated she wants to see a doctor. Please advise.

## 2021-02-21 NOTE — Telephone Encounter (Signed)
LVM for return call. 

## 2021-02-26 NOTE — Telephone Encounter (Signed)
LVM to return call.

## 2021-03-21 ENCOUNTER — Ambulatory Visit: Payer: Self-pay

## 2021-03-21 ENCOUNTER — Other Ambulatory Visit: Payer: Self-pay

## 2021-03-21 VITALS — BP 131/71

## 2021-03-21 DIAGNOSIS — Z0130 Encounter for examination of blood pressure without abnormal findings: Secondary | ICD-10-CM

## 2021-03-21 NOTE — Progress Notes (Signed)
Presents to [facility] requesting blood pressure check.  States this morning felt a little dizzy while getting ready for work.  States ate sushi last night.  BP WNL - advised to return to clinic if any other S/Sx develop.   AMD

## 2021-03-22 ENCOUNTER — Ambulatory Visit: Payer: Self-pay

## 2021-03-22 VITALS — BP 132/75

## 2021-03-22 DIAGNOSIS — Z0130 Encounter for examination of blood pressure without abnormal findings: Secondary | ICD-10-CM

## 2021-03-26 ENCOUNTER — Ambulatory Visit: Payer: Self-pay

## 2021-03-26 ENCOUNTER — Other Ambulatory Visit: Payer: Self-pay

## 2021-03-26 VITALS — BP 141/82 | Wt 122.6 lb

## 2021-03-26 DIAGNOSIS — Z0130 Encounter for examination of blood pressure without abnormal findings: Secondary | ICD-10-CM

## 2021-03-26 NOTE — Progress Notes (Signed)
Pt concerned of bp over the weekend. Pt did keep notes of blood pressure and will bring at her appointment tomorrow. CL,RMA

## 2021-03-27 ENCOUNTER — Ambulatory Visit: Payer: Self-pay | Admitting: Physician Assistant

## 2021-04-03 NOTE — Progress Notes (Signed)
Scheduled to complete physical 04/11/21 with Ron Smith, PA-C.  AMD 

## 2021-04-04 ENCOUNTER — Other Ambulatory Visit: Payer: Self-pay

## 2021-04-04 ENCOUNTER — Ambulatory Visit: Payer: Self-pay

## 2021-04-04 DIAGNOSIS — Z0000 Encounter for general adult medical examination without abnormal findings: Secondary | ICD-10-CM

## 2021-04-04 LAB — POCT URINALYSIS DIPSTICK
Bilirubin, UA: NEGATIVE
Blood, UA: NEGATIVE
Glucose, UA: NEGATIVE
Ketones, UA: NEGATIVE
Leukocytes, UA: NEGATIVE
Nitrite, UA: NEGATIVE
Protein, UA: NEGATIVE
Spec Grav, UA: 1.015 (ref 1.010–1.025)
Urobilinogen, UA: 0.2 E.U./dL
pH, UA: 6.5 (ref 5.0–8.0)

## 2021-04-05 ENCOUNTER — Other Ambulatory Visit: Payer: Self-pay | Admitting: Neurology

## 2021-04-05 LAB — CMP12+LP+TP+TSH+6AC+CBC/D/PLT
ALT: 20 IU/L (ref 0–32)
AST: 23 IU/L (ref 0–40)
Albumin/Globulin Ratio: 2.3 — ABNORMAL HIGH (ref 1.2–2.2)
Albumin: 4.4 g/dL (ref 3.7–4.7)
Alkaline Phosphatase: 77 IU/L (ref 44–121)
BUN/Creatinine Ratio: 25 (ref 12–28)
BUN: 16 mg/dL (ref 8–27)
Basophils Absolute: 0.1 10*3/uL (ref 0.0–0.2)
Basos: 2 %
Bilirubin Total: 0.3 mg/dL (ref 0.0–1.2)
Calcium: 9.2 mg/dL (ref 8.7–10.3)
Chloride: 103 mmol/L (ref 96–106)
Chol/HDL Ratio: 3.2 ratio (ref 0.0–4.4)
Cholesterol, Total: 211 mg/dL — ABNORMAL HIGH (ref 100–199)
Creatinine, Ser: 0.65 mg/dL (ref 0.57–1.00)
EOS (ABSOLUTE): 0.1 10*3/uL (ref 0.0–0.4)
Eos: 3 %
Estimated CHD Risk: <0.5 times avg. (ref 0.0–1.0)
Free Thyroxine Index: 1.9 (ref 1.2–4.9)
GGT: 20 IU/L (ref 0–60)
Globulin, Total: 1.9 g/dL (ref 1.5–4.5)
Glucose: 94 mg/dL (ref 65–99)
HDL: 66 mg/dL (ref >39)
Hematocrit: 39.9 % (ref 34.0–46.6)
Hemoglobin: 14.1 g/dL (ref 11.1–15.9)
Immature Grans (Abs): 0 10*3/uL (ref 0.0–0.1)
Immature Granulocytes: 1 %
Iron: 82 ug/dL (ref 27–139)
LDH: 169 IU/L (ref 119–226)
LDL Chol Calc (NIH): 131 mg/dL — ABNORMAL HIGH (ref 0–99)
Lymphocytes Absolute: 1.2 10*3/uL (ref 0.7–3.1)
Lymphs: 31 %
MCH: 33.1 pg — ABNORMAL HIGH (ref 26.6–33.0)
MCHC: 35.3 g/dL (ref 31.5–35.7)
MCV: 94 fL (ref 79–97)
Monocytes Absolute: 0.5 10*3/uL (ref 0.1–0.9)
Monocytes: 12 %
Neutrophils Absolute: 2 10*3/uL (ref 1.4–7.0)
Neutrophils: 51 %
Phosphorus: 3.5 mg/dL (ref 3.0–4.3)
Platelets: 188 10*3/uL (ref 150–450)
Potassium: 4.1 mmol/L (ref 3.5–5.2)
RBC: 4.26 x10E6/uL (ref 3.77–5.28)
RDW: 12.6 % (ref 11.7–15.4)
Sodium: 139 mmol/L (ref 134–144)
T3 Uptake Ratio: 29 % (ref 24–39)
T4, Total: 6.4 ug/dL (ref 4.5–12.0)
TSH: 2.07 u[IU]/mL (ref 0.450–4.500)
Total Protein: 6.3 g/dL (ref 6.0–8.5)
Triglycerides: 78 mg/dL (ref 0–149)
Uric Acid: 3.9 mg/dL (ref 3.1–7.9)
VLDL Cholesterol Cal: 14 mg/dL (ref 5–40)
WBC: 3.9 10*3/uL (ref 3.4–10.8)
eGFR: 93 mL/min/{1.73_m2} (ref >59)

## 2021-04-11 ENCOUNTER — Ambulatory Visit: Payer: Self-pay | Admitting: Physician Assistant

## 2021-04-11 ENCOUNTER — Encounter: Payer: Self-pay | Admitting: Physician Assistant

## 2021-04-11 ENCOUNTER — Other Ambulatory Visit: Payer: Self-pay

## 2021-04-11 VITALS — BP 149/71 | HR 66 | Temp 97.9°F | Resp 14 | Ht 60.0 in | Wt 123.0 lb

## 2021-04-11 DIAGNOSIS — G4730 Sleep apnea, unspecified: Secondary | ICD-10-CM

## 2021-04-11 DIAGNOSIS — Z0000 Encounter for general adult medical examination without abnormal findings: Secondary | ICD-10-CM

## 2021-04-11 NOTE — Progress Notes (Signed)
Pt has had issues with her blood pressure. Pt brought in both bp cuffs that she owns to compare. Pt has came in previously to check her bp and it has been high. Pt has kept memory in her bp machines of all her reading she has been getting. CL,RMA

## 2021-04-11 NOTE — Progress Notes (Signed)
   Subjective: Annual physical exam    Patient ID: Makayla Wade, female    DOB: 11/13/1951, 70 y.o.   MRN: [id number]  HPI Patient presents for annual physical exam.  Patient voices concern for elevated blood pressure, suspected sleep apnea, and daytime somnolence. Patient has 2 blood pressure machines at home she will intermitted blood pressures ranging from 1 60-1 40 systolic and 85-71 diastolic.  Denies headache, vision disturbance, vertigo.  Patient states she has noticed increased daytime somnolence which has been observed by her children.  Patient also states she weighs a fit bit watch which shows abnormal sleep patterns.  Review of Systems Arthritis, anxiety, and osteopenia.    Objective:   Physical Exam No acute distress.  Temperature is 97.9, pulse 66, respiration 14, BP is 149/71, and weight is 123 pounds. No acute distress.  HEENT is unremarkable.  Neck is supple for appendectomy or bruits.  Lungs are clear to auscultation.  Heart regular rate and rhythm.  No acute findings on EKG. Abdomen with negative HSM, normoactive bowel sounds soft, nontender to palpation. No obvious deformity to the upper or lower extremities.  Patient has full and equal range of motion of the upper and lower extremities. No obvious deformity to cervical lumbar spine.  Patient has full and equal range of motion cervical lumbar spine. Cranial nerves II through XII grossly intact.        Assessment & Plan: Well exam  Further evaluation of patient's new complaint is warranted.  No interventions for mildly elevated blood pressures at this time.  Discussed lab results with patient.  We will follow-up after sleep study.

## 2021-04-20 DIAGNOSIS — Z872 Personal history of diseases of the skin and subcutaneous tissue: Secondary | ICD-10-CM | POA: Diagnosis not present

## 2021-04-20 DIAGNOSIS — D2261 Melanocytic nevi of right upper limb, including shoulder: Secondary | ICD-10-CM | POA: Diagnosis not present

## 2021-04-20 DIAGNOSIS — D225 Melanocytic nevi of trunk: Secondary | ICD-10-CM | POA: Diagnosis not present

## 2021-04-20 DIAGNOSIS — D2272 Melanocytic nevi of left lower limb, including hip: Secondary | ICD-10-CM | POA: Diagnosis not present

## 2021-04-20 DIAGNOSIS — D2271 Melanocytic nevi of right lower limb, including hip: Secondary | ICD-10-CM | POA: Diagnosis not present

## 2021-04-20 DIAGNOSIS — Z08 Encounter for follow-up examination after completed treatment for malignant neoplasm: Secondary | ICD-10-CM | POA: Diagnosis not present

## 2021-04-20 DIAGNOSIS — Z86006 Personal history of melanoma in-situ: Secondary | ICD-10-CM | POA: Diagnosis not present

## 2021-04-20 DIAGNOSIS — D2262 Melanocytic nevi of left upper limb, including shoulder: Secondary | ICD-10-CM | POA: Diagnosis not present

## 2021-04-20 DIAGNOSIS — Z85820 Personal history of malignant melanoma of skin: Secondary | ICD-10-CM | POA: Diagnosis not present

## 2021-04-20 DIAGNOSIS — L821 Other seborrheic keratosis: Secondary | ICD-10-CM | POA: Diagnosis not present

## 2021-04-24 ENCOUNTER — Other Ambulatory Visit: Payer: Self-pay

## 2021-04-24 ENCOUNTER — Other Ambulatory Visit: Payer: Self-pay | Admitting: Nurse Practitioner

## 2021-04-24 DIAGNOSIS — R319 Hematuria, unspecified: Secondary | ICD-10-CM

## 2021-04-24 DIAGNOSIS — N3001 Acute cystitis with hematuria: Secondary | ICD-10-CM

## 2021-04-24 LAB — POCT URINALYSIS DIPSTICK
Bilirubin, UA: NEGATIVE
Blood, UA: POSITIVE
Glucose, UA: NEGATIVE
Ketones, UA: NEGATIVE
Nitrite, UA: NEGATIVE
Protein, UA: POSITIVE — AB
Spec Grav, UA: 1.025 (ref 1.010–1.025)
Urobilinogen, UA: 0.2 E.U./dL
pH, UA: 6 (ref 5.0–8.0)

## 2021-04-24 MED ORDER — NITROFURANTOIN MONOHYD MACRO 100 MG PO CAPS: 100.0000 mg | ORAL_CAPSULE | Freq: Two times a day (BID) | ORAL | 0 refills | Status: AC

## 2021-04-24 NOTE — Progress Notes (Signed)
Presents to [facility] C/O UTI symptoms since late Sunday afternoon.  S/Sx: Pain with urination Frequent urination Feeling like she has to urinate & nothing comes out but a dribble Some low back pain & pelvic pain radiating into her upper legs. Feeling "blah" Afebrile - states she's checked her temp & hasn't had any elevated temperature.  Woke up during the night 2x feeling like she had to urinate & nothing would come out.  AMD

## 2021-04-24 NOTE — Progress Notes (Signed)
70 year old female presenting to [facility] for urine sample today after having 3 days of urinary frequency and pain. Denies fever. She has had a UTI in the past. 2011 bladder surgery. She has daily urinary incontinence since. She wears a  Pad daily as well.   No physical exam performed- nurse visit for urine sample f/u phone call only with provider.   Allergies  Allergen Reactions  . Sulfa Antibiotics     Funny sensations, rapid heartbeats  . Copper-Containing Compounds     Current Outpatient Medications  Medication Instructions  . alendronate (FOSAMAX) 70 mg, Oral, Weekly, Take with a full glass of water on an empty stomach.   . Ascorbic Acid (VITAMIN C) 100 MG tablet Oral  . baclofen (LIORESAL) 10 MG tablet TAKE ONE TABLET BY MOUTH TWICE DAILY  . Calcium Carbonate-Vitamin D 600-400 MG-UNIT tablet 1 tablet, Daily  . cholecalciferol (VITAMIN D) 1,000 Units, Daily  . clonazePAM (KLONOPIN) 0.5 MG tablet TAKE ONE TABLET BY MOUTH TWICE DAILY  . meloxicam (MOBIC) 15 mg, Oral, Daily PRN  . Multiple Vitamins-Minerals (CENTRUM SILVER ADULT 50+ PO) 1 tablet, Daily  . nitrofurantoin (macrocrystal-monohydrate) (MACROBID) 100 mg, Oral, 2 times daily   Recent Results (from the past 2160 hour(s))  CMP12+LP+TP+TSH+6AC+CBC/D/Plt     Status: Abnormal   Collection Time: 04/04/21  8:36 AM  Result Value Ref Range   Glucose 94 65 - 99 mg/dL   Uric Acid 3.9 3.1 - 7.9 mg/dL    Comment:            Therapeutic target for gout patients: <6.0   BUN 16 8 - 27 mg/dL   Creatinine, Ser 0.65 0.57 - 1.00 mg/dL   eGFR 93 >59 mL/min/1.73   BUN/Creatinine Ratio 25 12 - 28   Sodium 139 134 - 144 mmol/L   Potassium 4.1 3.5 - 5.2 mmol/L   Chloride 103 96 - 106 mmol/L   Calcium 9.2 8.7 - 10.3 mg/dL   Phosphorus 3.5 3.0 - 4.3 mg/dL   Total Protein 6.3 6.0 - 8.5 g/dL   Albumin 4.4 3.7 - 4.7 g/dL   Globulin, Total 1.9 1.5 - 4.5 g/dL   Albumin/Globulin Ratio 2.3 (H) 1.2 - 2.2   Bilirubin Total 0.3 0.0 - 1.2 mg/dL    Alkaline Phosphatase 77 44 - 121 IU/L   LDH 169 119 - 226 IU/L   AST 23 0 - 40 IU/L   ALT 20 0 - 32 IU/L   GGT 20 0 - 60 IU/L   Iron 82 27 - 139 ug/dL   Cholesterol, Total 211 (H) 100 - 199 mg/dL   Triglycerides 78 0 - 149 mg/dL   HDL 66 >39 mg/dL   VLDL Cholesterol Cal 14 5 - 40 mg/dL   LDL Chol Calc (NIH) 131 (H) 0 - 99 mg/dL   Chol/HDL Ratio 3.2 0.0 - 4.4 ratio    Comment:                                   T. Chol/HDL Ratio                                             Men  Women  1/2 Avg.Risk  3.4    3.3                                   Avg.Risk  5.0    4.4                                2X Avg.Risk  9.6    7.1                                3X Avg.Risk 23.4   11.0    Estimated CHD Risk  < 0.5 0.0 - 1.0 times avg.    Comment: The CHD Risk is based on the T. Chol/HDL ratio. Other factors affect CHD Risk such as hypertension, smoking, diabetes, severe obesity, and family history of premature CHD.    TSH 2.070 0.450 - 4.500 uIU/mL   T4, Total 6.4 4.5 - 12.0 ug/dL   T3 Uptake Ratio 29 24 - 39 %   Free Thyroxine Index 1.9 1.2 - 4.9   WBC 3.9 3.4 - 10.8 x10E3/uL   RBC 4.26 3.77 - 5.28 x10E6/uL   Hemoglobin 14.1 11.1 - 15.9 g/dL   Hematocrit 39.9 34.0 - 46.6 %   MCV 94 79 - 97 fL   MCH 33.1 (H) 26.6 - 33.0 pg   MCHC 35.3 31.5 - 35.7 g/dL   RDW 12.6 11.7 - 15.4 %   Platelets 188 150 - 450 x10E3/uL   Neutrophils 51 Not Estab. %   Lymphs 31 Not Estab. %   Monocytes 12 Not Estab. %   Eos 3 Not Estab. %   Basos 2 Not Estab. %   Neutrophils Absolute 2.0 1.4 - 7.0 x10E3/uL   Lymphocytes Absolute 1.2 0.7 - 3.1 x10E3/uL   Monocytes Absolute 0.5 0.1 - 0.9 x10E3/uL   EOS (ABSOLUTE) 0.1 0.0 - 0.4 x10E3/uL   Basophils Absolute 0.1 0.0 - 0.2 x10E3/uL   Immature Granulocytes 1 Not Estab. %   Immature Grans (Abs) 0.0 0.0 - 0.1 x10E3/uL  POCT urinalysis dipstick     Status: Normal   Collection Time: 04/04/21  9:08 AM  Result Value Ref Range   Color, UA  light yellow    Clarity, UA clear    Glucose, UA Negative Negative   Bilirubin, UA negative    Ketones, UA negative    Spec Grav, UA 1.015 1.010 - 1.025   Blood, UA negative    pH, UA 6.5 5.0 - 8.0   Protein, UA Negative Negative   Urobilinogen, UA 0.2 0.2 or 1.0 E.U./dL   Nitrite, UA negative    Leukocytes, UA Negative Negative   Appearance light    Odor    POCT urinalysis dipstick     Status: Abnormal   Collection Time: 04/24/21 10:46 AM  Result Value Ref Range   Color, UA Yellow    Clarity, UA Cloudy    Glucose, UA Negative Negative   Bilirubin, UA Negative    Ketones, UA Negative    Spec Grav, UA 1.025 1.010 - 1.025   Blood, UA Positive     Comment: 2+   pH, UA 6.0 5.0 - 8.0   Protein, UA Positive (A) Negative    Comment: 1+   Urobilinogen, UA 0.2 0.2 or 1.0 E.U./dL   Nitrite, UA Negatie  Leukocytes, UA Moderate (2+) (A) Negative    Comment: 2+   Appearance     Odor     Advised starting antibiotic, push fluids. RTC if symptoms persist will also send out culture.   Meds ordered this encounter  Medications  . nitrofurantoin, macrocrystal-monohydrate, (MACROBID) 100 MG capsule    Sig: Take 1 capsule (100 mg total) by mouth 2 (two) times daily for 7 days.    Dispense:  14 capsule    Refill:  0

## 2021-04-28 LAB — URINE CULTURE

## 2021-04-30 ENCOUNTER — Telehealth: Payer: Self-pay

## 2021-04-30 NOTE — Telephone Encounter (Addendum)
Makayla Wade informed me that she hasn't been contacted to schedule a sleep study.  Referral was faxed to [facility] on 04/11/21.  Contacted [facility] at [phone].  On the form there were two phone numbers listed - one for home & one for cell - Same #'s except a couple of numbers were transposed.  They said they tried to contact patient 3x & left a message, but not sure on which number. Checked phone numbers in Epic & home and cell number are the same [phone]).  Informed [facility] the correct phone number.  They said they will contact her to schedule the sleep study.  Tried to call [name] at [phone] & it went straight to voice mail.  Left message that [name] will contact her to schedule a sleep study.  And, that Ron documented on her physical that he will address the blood pressure after the sleep study as he thinks her blood pressure may be related to sleep issues.  AMD

## 2021-06-01 ENCOUNTER — Encounter: Payer: Self-pay | Admitting: Physician Assistant

## 2021-06-01 ENCOUNTER — Other Ambulatory Visit: Payer: Self-pay

## 2021-06-01 ENCOUNTER — Ambulatory Visit: Payer: Self-pay | Admitting: Physician Assistant

## 2021-06-01 VITALS — BP 147/81 | HR 72 | Temp 97.6°F | Resp 14 | Ht 59.0 in | Wt 119.0 lb

## 2021-06-01 DIAGNOSIS — G4730 Sleep apnea, unspecified: Secondary | ICD-10-CM

## 2021-06-01 NOTE — Progress Notes (Signed)
   Subjective: Sleep disorder    Patient ID: Makayla Wade, female    DOB: Jun 11, 1951, 70 y.o.   MRN: [id number]  HPI Patient is here to discuss denial from insurance for sleep study.  We discussed contents of the denial letter for sleep study from the insurance.  The denial was based on vague list of her medical conditions preventing her to having a home sleep study..  Patient has a long-term history of MS and anxiety.  She is upset today would not even approved the study to see if she will qualify as having sleep apnea.   Review of Systems As above    Objective:   Physical Exam  Deferred      Assessment & Plan: Sleep disorder   Patient patient plans to follow appeal of the insurance decision denies sleep study.

## 2021-07-10 ENCOUNTER — Other Ambulatory Visit: Payer: Self-pay | Admitting: Neurology

## 2021-07-11 ENCOUNTER — Encounter: Payer: Self-pay | Admitting: Neurology

## 2021-07-11 ENCOUNTER — Ambulatory Visit (INDEPENDENT_AMBULATORY_CARE_PROVIDER_SITE_OTHER): Payer: 59 | Admitting: Neurology

## 2021-07-11 VITALS — BP 140/84 | HR 69 | Ht 59.0 in | Wt 120.2 lb

## 2021-07-11 DIAGNOSIS — G35 Multiple sclerosis: Secondary | ICD-10-CM

## 2021-07-11 DIAGNOSIS — G43109 Migraine with aura, not intractable, without status migrainosus: Secondary | ICD-10-CM | POA: Diagnosis not present

## 2021-07-11 MED ORDER — PROPRANOLOL HCL 20 MG PO TABS: 20.0000 mg | ORAL_TABLET | Freq: Two times a day (BID) | ORAL | 3 refills | Status: DC

## 2021-07-11 NOTE — Progress Notes (Signed)
Reason for visit: Multiple sclerosis, migraine headache  Makayla Wade is an 70 y.o. female  History of present illness:  Makayla Wade is a 70 year old right-handed white female with a history of multiple sclerosis.  The patient is not on any disease modifying agents, she was diagnosed with multiple sclerosis in 1969.  She continues to work for the [employer].  She has a history of migraine headache that is primarily manifested with visual aura with minimal headache but the patient feels wiped out and spacey afterwards.  The episodes have become more frequent, occurring 3-4 times a week now.  The patient began having some left hand numbness in March 2022.  She continues to have this issue off and on, she does have some neck discomfort.  She does a lot of work on the computer throughout the day.  She has not had any change in her ability to ambulate, and no change in vision or bowel or bladder function had been noted.  Past Medical History:  Diagnosis Date   Anxiety    Depression    Hx.   Elevated blood pressure reading    Hemorrhoids    Hx.   Migraine headache    Multiple sclerosis (HCC)    Optic neuritis, right    Osteoporosis    Seizures (HCC)    Hx.    Syncope    Ulcerative colitis (HCC)     Past Surgical History:  Procedure Laterality Date   BREAST LUMPECTOMY     CESAREAN SECTION     Wade SURGERY     INCONTINENCE SURGERY     OVARIAN CYST SURGERY      Family History  Problem Relation Age of Onset   Cancer Mother    Heart attack Father    Cancer Father    Cancer Maternal Grandmother    Cancer Paternal Grandfather     Social history:  reports that she quit smoking about 52 years ago. Her smoking use included cigarettes. She has never used smokeless tobacco. She reports current alcohol use of about 14.0 standard drinks of alcohol per week. She reports that she does not use drugs.    Allergies  Allergen Reactions   Sulfa Antibiotics     Funny sensations,  rapid heartbeats   Copper-Containing Compounds     Medications:  Prior to Admission medications   Medication Sig Start Date End Date Taking? Authorizing Provider  alendronate (FOSAMAX) 70 MG tablet Take 70 mg by mouth once a week. Take with a full glass of water on an empty stomach.   Yes [provider]  Ascorbic Acid (VITAMIN C) 100 MG tablet Take by mouth. 07/18/20  Yes [provider]  baclofen (LIORESAL) 10 MG tablet TAKE ONE TABLET BY MOUTH TWICE DAILY 07/31/20  Yes York Spaniel, MD  Calcium Carbonate-Vitamin D 600-400 MG-UNIT tablet Take 1 tablet by mouth daily.   Yes [provider]  cholecalciferol (VITAMIN D) 1000 UNITS tablet Take 1,000 Units by mouth daily.   Yes [provider]  clonazePAM (KLONOPIN) 0.5 MG tablet TAKE ONE TABLET BY MOUTH TWICE DAILY 12/11/20  Yes York Spaniel, MD  meloxicam (MOBIC) 15 MG tablet Take 15 mg by mouth daily as needed.  04/20/20  Yes [provider]  Multiple Vitamins-Minerals (CENTRUM SILVER ADULT 50+ PO) Take 1 tablet by mouth daily.   Yes [provider]    ROS:  Out of a complete 14 system review of symptoms, the patient complains  only of the following symptoms, and all other reviewed systems are negative.  Hand numbness Headache Fatigue  Blood pressure 140/84, pulse 69, height 4\' 11"  (1.499 m), weight 120 lb 4 oz (54.5 kg), SpO2 97 %.  Physical Exam  General: The patient is alert and cooperative at the time of the examination.  Skin: No significant peripheral edema is noted.   Neurologic Exam  Mental status: The patient is alert and oriented x 3 at the time of the examination. The patient has apparent normal recent and remote memory, with an apparently normal attention span and concentration ability.   Cranial nerves: Facial symmetry is present. Speech is normal, no aphasia or dysarthria is noted. Extraocular movements are full. Visual fields are full.  Pupils are equal,  round, and reactive to light.  Discs are flat bilaterally.  Motor: The patient has good strength in all 4 extremities.  Sensory examination: Soft touch sensation is symmetric on the face, arms, and legs.  Coordination: The patient has good finger-nose-finger and heel-to-shin bilaterally.  Gait and station: The patient has a normal gait. Tandem gait is normal. Romberg is negative. No drift is seen.  The patient is able to walk on heels and the toes bilaterally.  Reflexes: Deep tendon reflexes are symmetric.   Assessment/Plan:  1.  Multiple sclerosis  2.  Migraine headache  The patient has had an increase in frequency of her migraine headache.  We will start propranolol taking 20 mg twice daily, she will call for any dose adjustments.  The patient will be set up for MRI of the brain to compare to the prior study done in 2019.  She will follow-up here in 6 months, in the future she can be followed through Dr. 2020.  Makayla Foot MD 07/11/2021 8:53 AM  [facility] [address] [place], [place]  Phone [phone] Fax [phone]

## 2021-07-11 NOTE — Patient Instructions (Signed)
We will start propranolol for the migraine.  Inderal (propranolol) is a blood pressure medication that is commonly used for migraine headaches. This is a type of beta blocker. The most common side effects include low heart rate, dizziness, fatigue, and increased depression. This medication may worsen asthma. If you believe that you are having side effects on this medication, please contact our office.  

## 2021-07-19 DIAGNOSIS — L910 Hypertrophic scar: Secondary | ICD-10-CM | POA: Diagnosis not present

## 2021-07-19 DIAGNOSIS — L738 Other specified follicular disorders: Secondary | ICD-10-CM | POA: Diagnosis not present

## 2021-07-19 DIAGNOSIS — D485 Neoplasm of uncertain behavior of skin: Secondary | ICD-10-CM | POA: Diagnosis not present

## 2021-08-17 ENCOUNTER — Other Ambulatory Visit: Payer: Self-pay

## 2021-08-17 ENCOUNTER — Ambulatory Visit: Payer: Self-pay

## 2021-08-17 VITALS — BP 132/82 | HR 78

## 2021-08-17 DIAGNOSIS — Z0130 Encounter for examination of blood pressure without abnormal findings: Secondary | ICD-10-CM

## 2021-09-14 DIAGNOSIS — Z23 Encounter for immunization: Secondary | ICD-10-CM | POA: Diagnosis not present

## 2021-09-24 ENCOUNTER — Other Ambulatory Visit: Payer: Self-pay | Admitting: *Deleted

## 2021-09-24 DIAGNOSIS — N644 Mastodynia: Secondary | ICD-10-CM

## 2021-09-24 NOTE — Progress Notes (Signed)
Per MD request orders placed for diagnostic bilateral mammo with Tomo and left breast US for evaluation of left breast pain.

## 2021-09-26 DIAGNOSIS — N644 Mastodynia: Secondary | ICD-10-CM | POA: Diagnosis not present

## 2021-09-26 LAB — HM MAMMOGRAPHY

## 2021-10-25 DIAGNOSIS — D225 Melanocytic nevi of trunk: Secondary | ICD-10-CM | POA: Diagnosis not present

## 2021-10-25 DIAGNOSIS — Z86006 Personal history of melanoma in-situ: Secondary | ICD-10-CM | POA: Diagnosis not present

## 2021-10-25 DIAGNOSIS — D2261 Melanocytic nevi of right upper limb, including shoulder: Secondary | ICD-10-CM | POA: Diagnosis not present

## 2021-10-25 DIAGNOSIS — D2262 Melanocytic nevi of left upper limb, including shoulder: Secondary | ICD-10-CM | POA: Diagnosis not present

## 2021-10-25 DIAGNOSIS — D2272 Melanocytic nevi of left lower limb, including hip: Secondary | ICD-10-CM | POA: Diagnosis not present

## 2021-10-25 DIAGNOSIS — Z85820 Personal history of malignant melanoma of skin: Secondary | ICD-10-CM | POA: Diagnosis not present

## 2021-11-16 ENCOUNTER — Telehealth: Payer: Self-pay

## 2021-11-16 ENCOUNTER — Other Ambulatory Visit: Payer: Self-pay | Admitting: Physician Assistant

## 2021-11-16 MED ORDER — NIRMATRELVIR/RITONAVIR (PAXLOVID)TABLET: 3.0000 | ORAL_TABLET | Freq: Two times a day (BID) | ORAL | 0 refills | Status: AC

## 2021-11-16 MED ORDER — PSEUDOEPH-BROMPHEN-DM 30-2-10 MG/5ML PO SYRP
5.0000 mL | ORAL_SOLUTION | Freq: Four times a day (QID) | ORAL | 0 refills | Status: DC | PRN
Start: 2021-11-16 — End: 2022-01-02

## 2021-11-16 NOTE — Telephone Encounter (Signed)
Makayla Wade called clinic this morning to notify us that she woke up with a fever of 102 F, cough, head congestion & generalized bodyaches.  She did a home rapid Covid Test & t he results are positive.  She has a Hx of MS  Requesting tx for Covid.  AMD

## 2021-12-13 ENCOUNTER — Other Ambulatory Visit: Payer: Self-pay

## 2021-12-13 MED ORDER — PROPRANOLOL HCL 20 MG PO TABS: 20.0000 mg | ORAL_TABLET | Freq: Two times a day (BID) | ORAL | 3 refills | Status: DC

## 2021-12-13 MED ORDER — BACLOFEN 10 MG PO TABS: 10.0000 mg | ORAL_TABLET | Freq: Two times a day (BID) | ORAL | 3 refills | Status: DC

## 2022-01-02 ENCOUNTER — Other Ambulatory Visit: Payer: Self-pay

## 2022-01-02 ENCOUNTER — Encounter: Payer: Self-pay | Admitting: Physician Assistant

## 2022-01-02 ENCOUNTER — Ambulatory Visit: Payer: Self-pay | Admitting: Physician Assistant

## 2022-01-02 VITALS — BP 164/84 | HR 70 | Temp 97.5°F | Resp 12 | Ht 59.0 in | Wt 120.0 lb

## 2022-01-02 DIAGNOSIS — G8929 Other chronic pain: Secondary | ICD-10-CM

## 2022-01-02 DIAGNOSIS — R0789 Other chest pain: Secondary | ICD-10-CM

## 2022-01-02 NOTE — Progress Notes (Signed)
Abdominal Ultrasound Complete scheduled for 01/03/2022 at 9:00 am with an arrival time of 8:30 am at [facility]). NPO p MN  Date, time & location of ultrasound given to patient along with NPO instructions.  Verbalized understanding.  AMD

## 2022-01-02 NOTE — Progress Notes (Signed)
Pt having indigestion pain, pain started near epigastric region and pain radiated to the back. Started today. 20 mins ago.

## 2022-01-02 NOTE — Progress Notes (Signed)
° °  Subjective: Epigastric pain radiating to back    Patient ID: Makayla Wade, female    DOB: 21-Nov-1951, 71 y.o.   MRN: [id number]  HPI Patient reports approximately 30 minutes ago she developed epigastric pain radiating to the back.  State onset of complaint lasted about 10 minutes.  Patient states she felt a little "woozy" but all of her complaint has resolved.  Patient past medical history remarkable for hypertension and noncompliance with medication.  Patient currently takes Inderal 20 mg.  Reviewed patient previous EKGs consistent with short PR syndrome.  Patient has a strong family history of AAA.   Review of Systems Ischemic cerebrovascular disease, migraine headaches, and multiple sclerosis.    Objective:   Physical Exam No acute distress.  Temperature is 97.5, pulse 70, respiration 12, BP is 164/84.  The patient 99% O2 sat on room air. HEENT is unremarkable.  Neck is supple without lymphadenopathy or bruits.  Lungs are clear to auscultation.  Heart is regular rate and rhythm.  Abdomen is negative HSM, soft, nontender to palpation.      Assessment & Plan: Epigastric pain.  Due to patient complaint ,family history, physical exam raise concern for air or aneurysm versus epigastric pain.  Further evaluation with abdominal ultrasound is warranted today.

## 2022-01-03 ENCOUNTER — Ambulatory Visit
Admission: RE | Admit: 2022-01-03 | Discharge: 2022-01-03 | Disposition: A | Discharge status: Home / Self Care | Payer: 59 | Source: Ambulatory Visit | Attending: Physician Assistant | Admitting: Physician Assistant

## 2022-01-03 DIAGNOSIS — G8929 Other chronic pain: Secondary | ICD-10-CM | POA: Diagnosis not present

## 2022-01-03 DIAGNOSIS — R1013 Epigastric pain: Secondary | ICD-10-CM | POA: Insufficient documentation

## 2022-01-03 DIAGNOSIS — R0789 Other chest pain: Secondary | ICD-10-CM | POA: Diagnosis not present

## 2022-01-03 DIAGNOSIS — K824 Cholesterolosis of gallbladder: Secondary | ICD-10-CM | POA: Diagnosis not present

## 2022-01-15 NOTE — Progress Notes (Deleted)
PATIENT: Makayla Wade DOB: 1951/09/22  REASON FOR VISIT: Follow up for MS and migraines HISTORY FROM: Patient PRIMARY NEUROLOGIST: Dr. Felecia Shelling   HISTORY OF PRESENT ILLNESS: Today 01/15/22 Makayla Wade here today for follow-up with history of MS and migraine headache.  She is not on any disease modifying MS medication.  She reported an increase in her migraine headaches at last visit with Dr. Jannifer [place], propanolol was started for prevention.  MRI of the brain was ordered to evaluate MS stability, but was not completed.  HISTORY  07/11/2021 Dr. Jannifer [place]: Makayla Wade is a 71 year old right-handed white female with a history of multiple sclerosis.  The patient is not on any disease modifying agents, she was diagnosed with multiple sclerosis in 1969.  She continues to work for the [employer].  She has a history of migraine headache that is primarily manifested with visual aura with minimal headache but the patient feels wiped out and spacey afterwards.  The episodes have become more frequent, occurring 3-4 times a week now.  The patient began having some left hand numbness in March 2022.  She continues to have this issue off and on, she does have some neck discomfort.  She does a lot of work on the computer throughout the day.  She has not had any change in her ability to ambulate, and no change in vision or bowel or bladder function had been noted.   REVIEW OF SYSTEMS: Out of a complete 14 system review of symptoms, the patient complains only of the following symptoms, and all other reviewed systems are negative.  See HPI  ALLERGIES: Allergies  Allergen Reactions   Sulfa Antibiotics Other (See Comments)    Funny sensations, rapid heartbeats   Copper-Containing Compounds     HOME MEDICATIONS: Outpatient Medications Prior to Visit  Medication Sig Dispense Refill   alendronate (FOSAMAX) 70 MG tablet Take 70 mg by mouth once a week. Take with a full glass of water on an empty stomach.      Ascorbic Acid (VITAMIN C) 100 MG tablet Take by mouth.     baclofen (LIORESAL) 10 MG tablet Take 1 tablet (10 mg total) by mouth 2 (two) times daily. 180 each 3   Calcium Carbonate-Vitamin D 600-400 MG-UNIT tablet Take 1 tablet by mouth daily.     cholecalciferol (VITAMIN D) 1000 UNITS tablet Take 1,000 Units by mouth daily.     clonazePAM (KLONOPIN) 0.5 MG tablet TAKE ONE TABLET BY MOUTH TWICE DAILY 180 tablet 1   Multiple Vitamins-Minerals (CENTRUM SILVER ADULT 50+ PO) Take 1 tablet by mouth daily.     propranolol (INDERAL) 20 MG tablet Take 1 tablet (20 mg total) by mouth 2 (two) times daily. 60 tablet 3   No facility-administered medications prior to visit.    PAST MEDICAL HISTORY: Past Medical History:  Diagnosis Date   Anxiety    Depression    Hx.   Elevated blood pressure reading    Hemorrhoids    Hx.   Migraine headache    Multiple sclerosis (HCC)    Optic neuritis, right    Osteoporosis    Seizures (HCC)    Hx.    Syncope    Ulcerative colitis ([place])     PAST SURGICAL HISTORY: Past Surgical History:  Procedure Laterality Date   BREAST LUMPECTOMY     CESAREAN SECTION     FOOT SURGERY     INCONTINENCE SURGERY     OVARIAN CYST SURGERY  FAMILY HISTORY: Family History  Problem Relation Age of Onset   Cancer Mother    Heart attack Father    Cancer Father    Cancer Maternal Grandmother    Cancer Paternal Grandfather     SOCIAL HISTORY: Social History   Socioeconomic History   Marital status: Divorced    Spouse name: Not on file   Number of children: 3   Years of education: HS   Highest education level: Not on file  Occupational History   Occupation: Editor, commissioning    Comment: [employer]  Tobacco Use   Smoking status: Former    Types: Cigarettes    Quit date: 12/09/1968    Years since quitting: 53.1   Smokeless tobacco: Never  Vaping Use   Vaping Use: Never used  Substance and Sexual Activity   Alcohol use: Yes     Alcohol/week: 14.0 standard drinks    Types: 14 Glasses of wine per week    Comment: Consumes 2 glasses of wine daily about    Drug use: No   Sexual activity: Not on file  Other Topics Concern   Not on file  Social History Narrative   Patient is right handed,resides alone      Patient drinks about 1-2 cups of caffeine daily.    Social Determinants of Health   Financial Resource Strain: Not on file  Food Insecurity: Not on file  Transportation Needs: Not on file  Physical Activity: Not on file  Stress: Not on file  Social Connections: Not on file  Intimate Partner Violence: Not on file      PHYSICAL EXAM  There were no vitals filed for this visit. There is no height or weight on file to calculate BMI.  Generalized: Well developed, in no acute distress   Neurological examination  Mentation: Alert oriented to time, place, history taking. Follows all commands speech and language fluent Cranial nerve II-XII: Pupils were equal round reactive to light. Extraocular movements were full, visual field were full on confrontational test. Facial sensation and strength were normal. Uvula tongue midline. Head turning and shoulder shrug  were normal and symmetric. Motor: The motor testing reveals 5 over 5 strength of all 4 extremities. Good symmetric motor tone is noted throughout.  Sensory: Sensory testing is intact to soft touch on all 4 extremities. No evidence of extinction is noted.  Coordination: Cerebellar testing reveals good finger-nose-finger and heel-to-shin bilaterally.  Gait and station: Gait is normal. Tandem gait is normal. Romberg is negative. No drift is seen.  Reflexes: Deep tendon reflexes are symmetric and normal bilaterally.   DIAGNOSTIC DATA (LABS, IMAGING, TESTING) - I reviewed patient records, labs, notes, testing and imaging myself where available.  Lab Results  Component Value Date   WBC 3.9 04/04/2021   HGB 14.1 04/04/2021   HCT 39.9 04/04/2021   MCV 94  04/04/2021   PLT 188 04/04/2021      Component Value Date/Time   NA 139 04/04/2021 0836   K 4.1 04/04/2021 0836   CL 103 04/04/2021 0836   CO2 24 01/29/2018 0844   GLUCOSE 94 04/04/2021 0836   GLUCOSE 79 10/16/2010 1000   BUN 16 04/04/2021 0836   CREATININE 0.65 04/04/2021 0836   CALCIUM 9.2 04/04/2021 0836   PROT 6.3 04/04/2021 0836   ALBUMIN 4.4 04/04/2021 0836   AST 23 04/04/2021 0836   ALT 20 04/04/2021 0836   ALKPHOS 77 04/04/2021 0836   BILITOT 0.3 04/04/2021 0836  GFRNONAA 92 05/04/2020 0910   GFRAA 105 05/04/2020 0910   Lab Results  Component Value Date   CHOL 211 (H) 04/04/2021   HDL 66 04/04/2021   LDLCALC 131 (H) 04/04/2021   TRIG 78 04/04/2021   CHOLHDL 3.2 04/04/2021   No results found for: HGBA1C No results found for: VITAMINB12 Lab Results  Component Value Date   TSH 2.070 04/04/2021      ASSESSMENT AND PLAN 71 y.o. year old female   1.  Multiple sclerosis 2.  Chronic migraine headache      Butler Denmark, AGNP-C, DNP 01/15/2022, 4:27 PM [facility] [address], [place], [place] [address] [phone]

## 2022-01-16 ENCOUNTER — Encounter: Payer: Self-pay | Admitting: Neurology

## 2022-01-16 ENCOUNTER — Ambulatory Visit: Payer: 59 | Admitting: Neurology

## 2022-02-25 ENCOUNTER — Encounter: Payer: 59 | Admitting: Physician Assistant

## 2022-03-05 ENCOUNTER — Other Ambulatory Visit: Payer: Self-pay

## 2022-03-05 ENCOUNTER — Encounter: Payer: Self-pay | Admitting: Internal Medicine

## 2022-03-05 ENCOUNTER — Ambulatory Visit (INDEPENDENT_AMBULATORY_CARE_PROVIDER_SITE_OTHER): Payer: 59 | Admitting: Internal Medicine

## 2022-03-05 VITALS — BP 112/64 | HR 94 | Temp 98.0°F | Resp 16 | Ht 59.0 in | Wt 118.5 lb

## 2022-03-05 DIAGNOSIS — G43109 Migraine with aura, not intractable, without status migrainosus: Secondary | ICD-10-CM | POA: Diagnosis not present

## 2022-03-05 DIAGNOSIS — Z1211 Encounter for screening for malignant neoplasm of colon: Secondary | ICD-10-CM | POA: Diagnosis not present

## 2022-03-05 DIAGNOSIS — Z23 Encounter for immunization: Secondary | ICD-10-CM

## 2022-03-05 DIAGNOSIS — M810 Age-related osteoporosis without current pathological fracture: Secondary | ICD-10-CM

## 2022-03-05 DIAGNOSIS — R030 Elevated blood-pressure reading, without diagnosis of hypertension: Secondary | ICD-10-CM | POA: Diagnosis not present

## 2022-03-05 DIAGNOSIS — G35 Multiple sclerosis: Secondary | ICD-10-CM | POA: Diagnosis not present

## 2022-03-05 NOTE — Progress Notes (Signed)
? ?New Patient Office Visit ? ?Subjective:  ?Patient ID: Makayla Wade, female    DOB: 09-Aug-1951  Age: 71 y.o. MRN: [id number] ? ?CC:  ?Chief Complaint  ?Patient presents with  ? Establish Care  ? ? ?HPI ?Makayla Wade presents to establish care.  ? ?MS: ?-Following with Neurology, not currently on any medications ?-Is on Baclofen and Klonopin for symptoms  ?-Diagnosed in 1984 ?-Last MRI brain in 02/2018 ?-Has had seizures in the past associated with MS, last seizure at least 30 years ago, not on any medication  ? ?UC: ?-Diagnosed on a past colonoscopy, no abnormalities other than polyp in 2018 ?-No symptoms of abdominal cramping, diarrhea or bloody stools ?-Does have some constipation but no other active issues ? ?Ocular Migraines: ?-Not currently on any medications ?-Symptoms resolve after 30 minutes  ?-Had 2 this last weekend  ? ?Elevated Blood Pressure Readings: ?-160's at work, had been prescribed Propanolol but made her feel very tired and stopped taking it  ?-Occasionally feels heart fluttering, no chest pain, no shortness of breath  ?-Checks blood pressure at home: 112-160/70-80 ? ?Health Maintenance: ?-Blood work due, gets labs through her job, will fax results  ?-Mammogram did have one last year, obtaining results  ?-DEXA - 03/2012 lumbar spine -1.9, left femur -2.7, right hip -3.0, left forearm -3.0. Currently on calcium and Vitamin D. Had been Fosamax 70 for the last 5 years but not on it currently. Has not been on any other medication. Did have bone scan last year.  ?-Colon cancer screening: colonoscopy 10/2017, repeat in 5-10 years ? ? ?Past Medical History:  ?Diagnosis Date  ? Anxiety   ? Depression   ? Hx.  ? Elevated blood pressure reading   ? Hemorrhoids   ? Hx.  ? Migraine headache   ? Multiple sclerosis (HCC)   ? Optic neuritis, right   ? Osteoporosis   ? Seizures (HCC)   ? Hx.   ? Syncope   ? Ulcerative colitis (HCC)   ? ? ?Past Surgical History:  ?Procedure Laterality Date  ? BREAST LUMPECTOMY     ? CESAREAN SECTION    ? FOOT SURGERY    ? INCONTINENCE SURGERY    ? OVARIAN CYST SURGERY    ? ? ?Family History  ?Problem Relation Age of Onset  ? Cancer Mother   ? Heart attack Father   ? Cancer Father   ? Cancer Maternal Grandmother   ? Cancer Paternal Grandfather   ? ? ?Social History  ? ?Socioeconomic History  ? Marital status: Divorced  ?  Spouse name: Not on file  ? Number of children: 3  ? Years of education: HS  ? Highest education level: Not on file  ?Occupational History  ? Occupation: Information systems manager  ?  Comment: [employer]  ?Tobacco Use  ? Smoking status: Former  ?  Types: Cigarettes  ?  Quit date: 12/09/1968  ?  Years since quitting: 53.2  ? Smokeless tobacco: Never  ?Vaping Use  ? Vaping Use: Never used  ?Substance and Sexual Activity  ? Alcohol use: Yes  ?  Alcohol/week: 14.0 standard drinks  ?  Types: 14 Glasses of wine per week  ?  Comment: Consumes 2 glasses of wine daily about   ? Drug use: No  ? Sexual activity: Not on file  ?Other Topics Concern  ? Not on file  ?Social History Narrative  ? Patient is right handed,resides alone  ?   ?  Patient drinks about 1-2 cups of caffeine daily.   ? ?Social Determinants of Health  ? ?Financial Resource Strain: Not on file  ?Food Insecurity: Not on file  ?Transportation Needs: Not on file  ?Physical Activity: Not on file  ?Stress: Not on file  ?Social Connections: Not on file  ?Intimate Partner Violence: Not on file  ? ? ?ROS ?Review of Systems  ?Constitutional:  Negative for chills and fever.  ?Eyes:  Negative for visual disturbance.  ?Respiratory:  Negative for cough.   ?Cardiovascular:  Positive for palpitations. Negative for chest pain.  ?Gastrointestinal:  Positive for constipation. Negative for abdominal pain, blood in stool, diarrhea, nausea and vomiting.  ? ?Objective:  ? ?Today's Vitals: BP 112/64   Pulse 94   Temp 98 ?F (36.7 ?C)   Resp 16   Ht 4\' 11"  (1.499 m)   Wt 118 lb 8 oz (53.8 kg)   SpO2 97%   BMI 23.93 kg/m?   ? ?Physical Exam ?Constitutional:   ?   Appearance: Normal appearance.  ?HENT:  ?   Head: Normocephalic and atraumatic.  ?   Nose: Nose normal.  ?   Mouth/Throat:  ?   Mouth: Mucous membranes are moist.  ?   Pharynx: Oropharynx is clear.  ?Eyes:  ?   Extraocular Movements: Extraocular movements intact.  ?   Conjunctiva/sclera: Conjunctivae normal.  ?   Pupils: Pupils are equal, round, and reactive to light.  ?Cardiovascular:  ?   Rate and Rhythm: Normal rate and regular rhythm.  ?Pulmonary:  ?   Effort: Pulmonary effort is normal.  ?   Breath sounds: Normal breath sounds.  ?Musculoskeletal:  ?   Right lower leg: No edema.  ?   Left lower leg: No edema.  ?Skin: ?   General: Skin is warm and dry.  ?Neurological:  ?   General: No focal deficit present.  ?   Mental Status: She is alert. Mental status is at baseline.  ?Psychiatric:     ?   Mood and Affect: Mood normal.     ?   Behavior: Behavior normal.  ? ? ?Assessment & Plan:  ? ?Problem List Items Addressed This Visit   ? ?  ? Cardiovascular and Mediastinum  ? Migraine with aura  ?  Stable, follows with Neurology.  ?  ?  ?  ? Nervous and Auditory  ? Multiple sclerosis (HCC) - Primary  ?  Not on any disease modifying agents, following with Neurology.  ?  ?  ?  ? Musculoskeletal and Integument  ? Osteoporosis  ?  Had been on Fosamax for 5 years but recently discontinued. Obtain records of last DEXA scan.  ?  ?  ? ?Other Visit Diagnoses   ? ? Elevated blood pressure reading      ? Need for pneumococcal vaccination      ? Relevant Orders  ? Pneumococcal conjugate vaccine 20-valent (Prevnar 20)  ? Colon cancer screening      ? Relevant Orders  ? Ambulatory referral to Gastroenterology  ? ?  ? ? ?Outpatient Encounter Medications as of 03/05/2022  ?Medication Sig  ? alendronate (FOSAMAX) 70 MG tablet Take 70 mg by mouth once a week. Take with a full glass of water on an empty stomach.  ? Ascorbic Acid (VITAMIN C) 100 MG tablet Take by mouth.  ? baclofen (LIORESAL) 10 MG  tablet Take 1 tablet (10 mg total) by mouth 2 (two) times daily.  ? Calcium Carbonate-Vitamin D  600-400 MG-UNIT tablet Take 1 tablet by mouth daily.  ? cholecalciferol (VITAMIN D) 1000 UNITS tablet Take 1,000 Units by mouth daily.  ? clonazePAM (KLONOPIN) 0.5 MG tablet TAKE ONE TABLET BY MOUTH TWICE DAILY  ? Multiple Vitamins-Minerals (CENTRUM SILVER ADULT 50+ PO) Take 1 tablet by mouth daily.  ? propranolol (INDERAL) 20 MG tablet Take 1 tablet (20 mg total) by mouth 2 (two) times daily.  ? ?No facility-administered encounter medications on file as of 03/05/2022.  ? ? ?Follow-up: Return in about 6 months (around 09/05/2022).  ? ?Margarita Mail, DO ? ?

## 2022-03-05 NOTE — Assessment & Plan Note (Signed)
Stable, follows with Neurology.  ?

## 2022-03-05 NOTE — Assessment & Plan Note (Signed)
Had been on Fosamax for 5 years but recently discontinued. Obtain records of last DEXA scan.  ?

## 2022-03-05 NOTE — Assessment & Plan Note (Signed)
Not on any disease modifying agents, following with Neurology.  ?

## 2022-03-05 NOTE — Patient Instructions (Addendum)
It was great seeing you today! ? ?Plan discussed at today's visit: ?-Pneumonia vaccine given today ?-Referral for colonoscopy  ?-Please send records of blood work, mammogram and bone scan  ?-Continue to check blood pressure at home and bring recordings to our next appointment  ? ?Follow up in: 6 months  ? ?Take care and let us know if you have any questions or concerns prior to your next visit. ? ?Dr. Rosana Berger ? ?

## 2022-04-02 ENCOUNTER — Other Ambulatory Visit: Payer: Self-pay | Admitting: Neurology

## 2022-04-03 ENCOUNTER — Telehealth: Payer: Self-pay | Admitting: Neurology

## 2022-04-03 NOTE — Telephone Encounter (Signed)
Last OV was on 07/11/21.  ?Next OV is scheduled for 04/11/22.  ?Last RX was written on 12/12/21 for 180 tabs.  ? ?[place] [facility] Database has been reviewed.  ?

## 2022-04-03 NOTE — Telephone Encounter (Signed)
Pt requesting refill for  clonazePAM (KLONOPIN) 0.5 MG tablet and baclofen (LIORESAL) 10 MG tablet at [facility].  ?Pt states pharmacy sent in a fax request today 04/03/2022.  ?

## 2022-04-03 NOTE — Telephone Encounter (Signed)
Reviewed pt chart. Rx baclofen already e-scribed by SS,NP to [facility] on 12/13/21 #180 with 3 refills. Should be on file at pharmacy. ? ?Checked drug registry. Last refilled clonazepam 12/12/21 #180.  ?Last saw Dr. Jannifer Franklin 07/11/21. Has appt with Dr. Felecia Shelling 05/01/22 for TOC since Dr. Jannifer Franklin retired.  ? ?It appears Ar[place], CMA already sent refill request to Dr. Felecia Shelling this am. Pending approval.  ?

## 2022-04-29 ENCOUNTER — Ambulatory Visit: Payer: Self-pay

## 2022-04-29 DIAGNOSIS — Z0000 Encounter for general adult medical examination without abnormal findings: Secondary | ICD-10-CM

## 2022-04-29 NOTE — Progress Notes (Signed)
Pt presents today for labs for [facility] andrews,MD

## 2022-04-30 LAB — CMP12+LP+TP+TSH+6AC+CBC/D/PLT
ALT: 14 IU/L (ref 0–32)
AST: 25 IU/L (ref 0–40)
Albumin/Globulin Ratio: 1.6 (ref 1.2–2.2)
Albumin: 4.4 g/dL (ref 3.7–4.7)
Alkaline Phosphatase: 82 IU/L (ref 44–121)
BUN/Creatinine Ratio: 24 (ref 12–28)
BUN: 16 mg/dL (ref 8–27)
Basophils Absolute: 0.1 10*3/uL (ref 0.0–0.2)
Basos: 1 %
Bilirubin Total: 0.5 mg/dL (ref 0.0–1.2)
Calcium: 9.9 mg/dL (ref 8.7–10.3)
Chloride: 104 mmol/L (ref 96–106)
Chol/HDL Ratio: 2.9 ratio (ref 0.0–4.4)
Cholesterol, Total: 202 mg/dL — ABNORMAL HIGH (ref 100–199)
Creatinine, Ser: 0.66 mg/dL (ref 0.57–1.00)
EOS (ABSOLUTE): 0.2 10*3/uL (ref 0.0–0.4)
Eos: 5 %
Estimated CHD Risk: <0.5 times avg. (ref 0.0–1.0)
Free Thyroxine Index: 2 (ref 1.2–4.9)
GGT: 15 IU/L (ref 0–60)
Globulin, Total: 2.7 g/dL (ref 1.5–4.5)
Glucose: 81 mg/dL (ref 70–99)
HDL: 69 mg/dL (ref >39)
Hematocrit: 43.5 % (ref 34.0–46.6)
Hemoglobin: 14.7 g/dL (ref 11.1–15.9)
Immature Grans (Abs): 0 10*3/uL (ref 0.0–0.1)
Immature Granulocytes: 1 %
Iron: 80 ug/dL (ref 27–139)
LDH: 233 IU/L — ABNORMAL HIGH (ref 119–226)
LDL Chol Calc (NIH): 120 mg/dL — ABNORMAL HIGH (ref 0–99)
Lymphocytes Absolute: 1.3 10*3/uL (ref 0.7–3.1)
Lymphs: 30 %
MCH: 32.7 pg (ref 26.6–33.0)
MCHC: 33.8 g/dL (ref 31.5–35.7)
MCV: 97 fL (ref 79–97)
Monocytes Absolute: 0.4 10*3/uL (ref 0.1–0.9)
Monocytes: 10 %
Neutrophils Absolute: 2.3 10*3/uL (ref 1.4–7.0)
Neutrophils: 53 %
Phosphorus: 3.4 mg/dL (ref 3.0–4.3)
Platelets: 208 10*3/uL (ref 150–450)
Potassium: 4.5 mmol/L (ref 3.5–5.2)
RBC: 4.5 x10E6/uL (ref 3.77–5.28)
RDW: 12.3 % (ref 11.7–15.4)
Sodium: 140 mmol/L (ref 134–144)
T3 Uptake Ratio: 26 % (ref 24–39)
T4, Total: 7.6 ug/dL (ref 4.5–12.0)
TSH: 4.32 u[IU]/mL (ref 0.450–4.500)
Total Protein: 7.1 g/dL (ref 6.0–8.5)
Triglycerides: 75 mg/dL (ref 0–149)
Uric Acid: 4.6 mg/dL (ref 3.1–7.9)
VLDL Cholesterol Cal: 13 mg/dL (ref 5–40)
WBC: 4.2 10*3/uL (ref 3.4–10.8)
eGFR: 93 mL/min/{1.73_m2} (ref >59)

## 2022-05-01 ENCOUNTER — Encounter: Payer: Self-pay | Admitting: Neurology

## 2022-05-01 ENCOUNTER — Ambulatory Visit (INDEPENDENT_AMBULATORY_CARE_PROVIDER_SITE_OTHER): Payer: 59 | Admitting: Neurology

## 2022-05-01 VITALS — BP 146/86 | HR 64 | Ht 59.0 in | Wt 117.0 lb

## 2022-05-01 DIAGNOSIS — E559 Vitamin D deficiency, unspecified: Secondary | ICD-10-CM

## 2022-05-01 DIAGNOSIS — G35 Multiple sclerosis: Secondary | ICD-10-CM

## 2022-05-01 DIAGNOSIS — G43109 Migraine with aura, not intractable, without status migrainosus: Secondary | ICD-10-CM

## 2022-05-01 MED ORDER — RIZATRIPTAN BENZOATE 5 MG PO TBDP: 5.0000 mg | ORAL_TABLET | ORAL | 11 refills | Status: DC | PRN

## 2022-05-01 NOTE — Progress Notes (Addendum)
[facility]  PATIENT: Makayla Wade DOB: [birth year]  REFERRING DOCTOR OR PCP: Margarita Mail, DO SOURCE: Patient, notes from Dr. Anne Hahn, imaging and lab reports, MRI images personally reviewed.  _________________________________   HISTORICAL  CHIEF COMPLAINT:  Chief Complaint  Patient presents with   Follow-up    Pt alone, rm 2 pt of Dr Anne Hahn, establishing with Dr Epimenio Foot. Overall stable     HISTORY OF PRESENT ILLNESS:  I had the pleasure seeing your patient, Makayla Wade, at the [facility].  She is a 71 year old woman with multiple sclerosis.  He is transferring care upon Dr. Clarisa Kindred retirement  At age 20 (1971) she had tingling in her hands that lasted several weeks.   She had a few other episodes of tingling over the next few years.   In 1976, she had serial CT scans (no MRI's) due to a focus that was seen.    MS was entertained (vs tumor).  In 1984, she moved to [place] and was unable to get any imaging.  She had a LP at [facility] (Dr. Loreta Ave) and it was c/w MS.   She had an MRI in 1984 and it was consistent with MS.    She was never on a DMT.   Her last exacerbation was probably in the 1980's/1990's.   She did have spells of spasticity, that were initially thought to be seizure.  She no longer gets these type of spells.  She has left hand numbness.    She does a lot of work on the computer throughout the day.    Gait and balance are doing ok - she has mild reduced balance but feels she keeps up with friends..   Sometimes the right side feels a little spastic.  No change in vision or bowel or bladder function had been noted..  Mood is fine.   Cognition is doing well.   She works with math daily (accounting)    She has a history of migraine headache primarily visual aura x 30-40 minutes with headache but the patient feels wiped out and spacey afterwards.  The episodes occur 3-4 times a month now.    She was tried on propranolol without  a benefit.   She has never been on sumatriptan.  Imaging personally reviewed:  MRI of the brain 05/19/2014 and 02/08/2018 showed an identical pattern of T2/FLAIR hyperintense foci in the periventricular, juxtacortical and deep white matter consistent with chronic demyelinating plaque associated with multiple sclerosis.  None of the foci appear to be acute they do not enhance.  No changes over the 4 years.   REVIEW OF SYSTEMS: Constitutional: No fevers, chills, sweats, or change in appetite Eyes: No visual changes, double vision, eye pain Ear, nose and throat: No hearing loss, ear pain, nasal congestion, sore throat Cardiovascular: No chest pain, palpitations Respiratory:  No shortness of breath at rest or with exertion.   No wheezes GastrointestinaI: No nausea, vomiting, diarrhea, abdominal pain, fecal incontinence Genitourinary:  No dysuria, urinary retention or frequency.  No nocturia. Musculoskeletal:  No neck pain, back pain Integumentary: No rash, pruritus, skin lesions Neurological: as above Psychiatric: No depression at this time.  No anxiety Endocrine: No palpitations, diaphoresis, change in appetite, change in weigh or increased thirst Hematologic/Lymphatic:  No anemia, purpura, petechiae. Allergic/Immunologic: No itchy/runny eyes, nasal congestion, recent allergic reactions, rashes  ALLERGIES: Allergies  Allergen Reactions   Sulfa Antibiotics Other (See Comments)    Funny sensations, rapid heartbeats  Copper-Containing Compounds     HOME MEDICATIONS:  Current Outpatient Medications:    Ascorbic Acid (VITAMIN C) 100 MG tablet, Take by mouth., Disp: , Rfl:    baclofen (LIORESAL) 10 MG tablet, Take 1 tablet (10 mg total) by mouth 2 (two) times daily., Disp: 180 each, Rfl: 3   Calcium Carbonate-Vitamin D 600-400 MG-UNIT tablet, Take 1 tablet by mouth daily., Disp: , Rfl:    cholecalciferol (VITAMIN D) 1000 UNITS tablet, Take 1,000 Units by mouth daily., Disp: , Rfl:     clonazePAM (KLONOPIN) 0.5 MG tablet, TAKE ONE TABLET BY MOUTH TWICE DAILY, Disp: 180 tablet, Rfl: 0   Multiple Vitamins-Minerals (CENTRUM SILVER ADULT 50+ PO), Take 1 tablet by mouth daily., Disp: , Rfl:    rizatriptan (MAXALT-MLT) 5 MG disintegrating tablet, Take 1 tablet (5 mg total) by mouth as needed for migraine. May repeat in 2 hours if needed, Disp: 10 tablet, Rfl: 11  PAST MEDICAL HISTORY: Past Medical History:  Diagnosis Date   Anxiety    Depression    Hx.   Elevated blood pressure reading    Hemorrhoids    Hx.   Hypertension    Migraine headache    Multiple sclerosis (HCC)    Optic neuritis, right    Osteoporosis    Seizures (HCC)    Hx.    Syncope    Ulcerative colitis (HCC)     PAST SURGICAL HISTORY: Past Surgical History:  Procedure Laterality Date   BREAST LUMPECTOMY     CESAREAN SECTION     FOOT SURGERY     INCONTINENCE SURGERY     OVARIAN CYST SURGERY      FAMILY HISTORY: Family History  Problem Relation Age of Onset   Cancer Mother    Heart attack Father    Cancer Father    Cancer Maternal Grandmother    Cancer Paternal Grandfather     SOCIAL HISTORY:  Social History   Socioeconomic History   Marital status: Divorced    Spouse name: Not on file   Number of children: 3   Years of education: HS   Highest education level: Not on file  Occupational History   Occupation: Information systems manager    Comment: [employer]  Tobacco Use   Smoking status: Former    Types: Cigarettes    Quit date: 12/09/1968    Years since quitting: 53.4   Smokeless tobacco: Never  Vaping Use   Vaping Use: Never used  Substance and Sexual Activity   Alcohol use: Yes    Alcohol/week: 14.0 standard drinks    Types: 14 Glasses of wine per week    Comment: Consumes 2 glasses of wine daily about    Drug use: No   Sexual activity: Not Currently  Other Topics Concern   Not on file  Social History Narrative   Patient is right handed,resides  alone      Patient drinks about 1-2 cups of caffeine daily.    Social Determinants of Health   Financial Resource Strain: Not on file  Food Insecurity: Not on file  Transportation Needs: Not on file  Physical Activity: Not on file  Stress: Not on file  Social Connections: Not on file  Intimate Partner Violence: Not on file     PHYSICAL EXAM  Vitals:   05/01/22 0958  BP: (!) 146/86  Pulse: 64  Weight: 117 lb (53.1 kg)  Height: 4\' 11"  (1.499 m)    Body mass index is 23.63  kg/m.   General: The patient is well-developed and well-nourished and in no acute distress  HEENT: Head is normocephalic and atraumatic.  Sclera are anicteric.  Neck: No carotid bruits are noted.  The neck is nontender.  Cardiovascular: The heart has a regular rate and rhythm with a normal S1 and S2. There were no murmurs, gallops or rubs.    Skin: Extremities are without rash or  edema.  Musculoskeletal:  Back is nontender  Neurologic Exam  Mental status: The patient is alert and oriented x 3 at the time of the examination. The patient has apparent normal recent and remote memory, with an apparently normal attention span and concentration ability.   Speech is normal.  Cranial nerves: Extraocular movements are full.  Vision was symmetric.  Facial strength and sensation was normal.  No obvious hearing deficits are noted.  Motor:  Muscle bulk is normal.   Tone is normal. Strength is  5 / 5 in all 4 extremities.   Sensory: Sensory testing is intact to pinprick, soft touch and vibration sensation in all 4 extremities.  Coordination: Cerebellar testing reveals good finger-nose-finger and heel-to-shin bilaterally.  Gait and station: Station is normal.   Gait is normal. Tandem gait is mildly wide. Romberg is negative.   Reflexes: Deep tendon reflexes are symmetric and normal bilaterally.   Plantar responses are flexor.    DIAGNOSTIC DATA (LABS, IMAGING, TESTING) - I reviewed patient records, labs,  notes, testing and imaging myself where available.  Lab Results  Component Value Date   WBC 4.2 04/29/2022   HGB 14.7 04/29/2022   HCT 43.5 04/29/2022   MCV 97 04/29/2022   PLT 208 04/29/2022      Component Value Date/Time   NA 140 04/29/2022 0841   K 4.5 04/29/2022 0841   CL 104 04/29/2022 0841   CO2 24 01/29/2018 0844   GLUCOSE 81 04/29/2022 0841   GLUCOSE 79 10/16/2010 1000   BUN 16 04/29/2022 0841   CREATININE 0.66 04/29/2022 0841   CALCIUM 9.9 04/29/2022 0841   PROT 7.1 04/29/2022 0841   ALBUMIN 4.4 04/29/2022 0841   AST 25 04/29/2022 0841   ALT 14 04/29/2022 0841   ALKPHOS 82 04/29/2022 0841   BILITOT 0.5 04/29/2022 0841   GFRNONAA 92 05/04/2020 0910   GFRAA 105 05/04/2020 0910   Lab Results  Component Value Date   CHOL 202 (H) 04/29/2022   HDL 69 04/29/2022   LDLCALC 120 (H) 04/29/2022   TRIG 75 04/29/2022   CHOLHDL 2.9 04/29/2022    Lab Results  Component Value Date   TSH 4.320 04/29/2022      ASSESSMENT AND PLAN  Multiple sclerosis (HCC)  Migraine with aura and without status migrainosus, not intractable  Vitamin D deficiency - Plan: VITAMIN D 25 Hydroxy (Vit-D Deficiency, Fractures)   Her MS was diagnosed in the 1970's.   No exacerbation x 30 years and last 2 MRI's were stable.   She will remain off a DMT.  She has had no exacerbations or MRI changes x many years.   If a relapse occurs, we will reassess. Trial of Maxalt for migraines with visual aura.   If migraines occur more than 2 x a week will recommend a prophylactic agent.   3.   Stay active and exercise as tolerated. 4.   Rtc one year  42-minute office visit with the majority of the time spent face-to-face for history and physical, discussion/counseling and decision-making.  Additional time with record review and documentation.  Deyona Soza A. Epimenio Foot, MD, [facility] 05/01/2022, 10:57 AM Certified in Neurology, Clinical Neurophysiology, Sleep Medicine and Neuroimaging  [facility] [address] [place], [place] [address] [phone]

## 2022-05-02 LAB — VITAMIN D 25 HYDROXY (VIT D DEFICIENCY, FRACTURES): Vit D, 25-Hydroxy: 21.9 ng/mL — ABNORMAL LOW (ref 30.0–100.0)

## 2022-05-03 DIAGNOSIS — D225 Melanocytic nevi of trunk: Secondary | ICD-10-CM | POA: Diagnosis not present

## 2022-05-03 DIAGNOSIS — L821 Other seborrheic keratosis: Secondary | ICD-10-CM | POA: Diagnosis not present

## 2022-05-03 DIAGNOSIS — D2262 Melanocytic nevi of left upper limb, including shoulder: Secondary | ICD-10-CM | POA: Diagnosis not present

## 2022-05-03 DIAGNOSIS — Z85820 Personal history of malignant melanoma of skin: Secondary | ICD-10-CM | POA: Diagnosis not present

## 2022-05-03 DIAGNOSIS — D2271 Melanocytic nevi of right lower limb, including hip: Secondary | ICD-10-CM | POA: Diagnosis not present

## 2022-05-03 DIAGNOSIS — D2261 Melanocytic nevi of right upper limb, including shoulder: Secondary | ICD-10-CM | POA: Diagnosis not present

## 2022-05-03 DIAGNOSIS — Z86006 Personal history of melanoma in-situ: Secondary | ICD-10-CM | POA: Diagnosis not present

## 2022-05-07 DIAGNOSIS — H524 Presbyopia: Secondary | ICD-10-CM | POA: Diagnosis not present

## 2022-05-07 DIAGNOSIS — H2513 Age-related nuclear cataract, bilateral: Secondary | ICD-10-CM | POA: Diagnosis not present

## 2022-05-07 DIAGNOSIS — Z135 Encounter for screening for eye and ear disorders: Secondary | ICD-10-CM | POA: Diagnosis not present

## 2022-05-07 DIAGNOSIS — H04123 Dry eye syndrome of bilateral lacrimal glands: Secondary | ICD-10-CM | POA: Diagnosis not present

## 2022-05-07 DIAGNOSIS — H52223 Regular astigmatism, bilateral: Secondary | ICD-10-CM | POA: Diagnosis not present

## 2022-05-07 DIAGNOSIS — H25013 Cortical age-related cataract, bilateral: Secondary | ICD-10-CM | POA: Diagnosis not present

## 2022-05-07 DIAGNOSIS — H5213 Myopia, bilateral: Secondary | ICD-10-CM | POA: Diagnosis not present

## 2022-05-07 DIAGNOSIS — G43109 Migraine with aura, not intractable, without status migrainosus: Secondary | ICD-10-CM | POA: Diagnosis not present

## 2022-05-08 ENCOUNTER — Encounter: Payer: Self-pay | Admitting: Physician Assistant

## 2022-05-08 ENCOUNTER — Encounter: Payer: Self-pay | Admitting: Internal Medicine

## 2022-05-08 DIAGNOSIS — Z85820 Personal history of malignant melanoma of skin: Secondary | ICD-10-CM | POA: Insufficient documentation

## 2022-05-09 ENCOUNTER — Encounter: Payer: Self-pay | Admitting: Internal Medicine

## 2022-06-07 DIAGNOSIS — G35 Multiple sclerosis: Secondary | ICD-10-CM | POA: Diagnosis not present

## 2022-06-07 DIAGNOSIS — H2513 Age-related nuclear cataract, bilateral: Secondary | ICD-10-CM | POA: Diagnosis not present

## 2022-06-07 DIAGNOSIS — H40003 Preglaucoma, unspecified, bilateral: Secondary | ICD-10-CM | POA: Diagnosis not present

## 2022-06-07 DIAGNOSIS — H43813 Vitreous degeneration, bilateral: Secondary | ICD-10-CM | POA: Diagnosis not present

## 2022-06-24 DIAGNOSIS — H2512 Age-related nuclear cataract, left eye: Secondary | ICD-10-CM | POA: Diagnosis not present

## 2022-07-16 ENCOUNTER — Encounter: Payer: Self-pay | Admitting: Ophthalmology

## 2022-07-22 ENCOUNTER — Other Ambulatory Visit: Payer: Self-pay | Admitting: Neurology

## 2022-07-22 NOTE — Discharge Instructions (Addendum)

## 2022-07-23 NOTE — Telephone Encounter (Signed)
Last OV was on 05/01/22.  Next OV is scheduled for 05/06/22.  Last RX was written on 04/03/22 for 180 tabs.    Drug Database has been reviewed.

## 2022-07-24 ENCOUNTER — Telehealth: Payer: Self-pay | Admitting: Neurology

## 2022-07-24 ENCOUNTER — Encounter
Admission: RE | Disposition: A | Discharge status: Home / Self Care | Payer: Self-pay | Source: Ambulatory Visit | Attending: Ophthalmology

## 2022-07-24 ENCOUNTER — Encounter: Payer: Self-pay | Admitting: Ophthalmology

## 2022-07-24 ENCOUNTER — Ambulatory Visit (AMBULATORY_SURGERY_CENTER): Payer: 59 | Admitting: Anesthesiology

## 2022-07-24 ENCOUNTER — Other Ambulatory Visit: Payer: Self-pay

## 2022-07-24 ENCOUNTER — Ambulatory Visit
Admission: RE | Admit: 2022-07-24 | Discharge: 2022-07-24 | Disposition: A | Discharge status: Home / Self Care | Payer: 59 | Source: Ambulatory Visit | Attending: Ophthalmology | Admitting: Ophthalmology

## 2022-07-24 ENCOUNTER — Ambulatory Visit: Payer: 59 | Admitting: Anesthesiology

## 2022-07-24 DIAGNOSIS — F418 Other specified anxiety disorders: Secondary | ICD-10-CM | POA: Diagnosis not present

## 2022-07-24 DIAGNOSIS — Z8711 Personal history of peptic ulcer disease: Secondary | ICD-10-CM | POA: Insufficient documentation

## 2022-07-24 DIAGNOSIS — R69 Illness, unspecified: Secondary | ICD-10-CM | POA: Diagnosis not present

## 2022-07-24 DIAGNOSIS — K449 Diaphragmatic hernia without obstruction or gangrene: Secondary | ICD-10-CM | POA: Insufficient documentation

## 2022-07-24 DIAGNOSIS — R569 Unspecified convulsions: Secondary | ICD-10-CM | POA: Insufficient documentation

## 2022-07-24 DIAGNOSIS — G43909 Migraine, unspecified, not intractable, without status migrainosus: Secondary | ICD-10-CM | POA: Diagnosis not present

## 2022-07-24 DIAGNOSIS — I10 Essential (primary) hypertension: Secondary | ICD-10-CM

## 2022-07-24 DIAGNOSIS — H2512 Age-related nuclear cataract, left eye: Secondary | ICD-10-CM | POA: Insufficient documentation

## 2022-07-24 DIAGNOSIS — Z87891 Personal history of nicotine dependence: Secondary | ICD-10-CM | POA: Diagnosis not present

## 2022-07-24 HISTORY — PX: CATARACT EXTRACTION W/PHACO: SHX586

## 2022-07-24 HISTORY — DX: Other seizures: G40.89

## 2022-07-24 SURGERY — PHACOEMULSIFICATION, CATARACT, WITH IOL INSERTION
Anesthesia: Monitor Anesthesia Care | Site: Eye | Laterality: Left

## 2022-07-24 MED ORDER — TETRACAINE HCL 0.5 % OP SOLN
1.0000 [drp] | OPHTHALMIC | Status: DC | PRN
  Administered 2022-07-24 (×3): 1 [drp] via OPHTHALMIC

## 2022-07-24 MED ORDER — SIGHTPATH DOSE#1 BSS IO SOLN
INTRAOCULAR | Status: DC | PRN
  Administered 2022-07-24: 1 mL

## 2022-07-24 MED ORDER — MIDAZOLAM HCL 2 MG/2ML IJ SOLN
INTRAMUSCULAR | Status: DC | PRN
  Administered 2022-07-24: 1 mg via INTRAVENOUS

## 2022-07-24 MED ORDER — ARMC OPHTHALMIC DILATING DROPS
1.0000 | OPHTHALMIC | Status: DC | PRN
  Administered 2022-07-24 (×3): 1 via OPHTHALMIC

## 2022-07-24 MED ORDER — SIGHTPATH DOSE#1 BSS IO SOLN
INTRAOCULAR | Status: DC | PRN
  Administered 2022-07-24: 15 mL

## 2022-07-24 MED ORDER — CEFUROXIME OPHTHALMIC INJECTION 1 MG/0.1 ML
INJECTION | OPHTHALMIC | Status: DC | PRN
  Administered 2022-07-24: 0.1 mL via INTRACAMERAL

## 2022-07-24 MED ORDER — BRIMONIDINE TARTRATE-TIMOLOL 0.2-0.5 % OP SOLN
OPHTHALMIC | Status: DC | PRN
  Administered 2022-07-24: 1 [drp] via OPHTHALMIC

## 2022-07-24 MED ORDER — SIGHTPATH DOSE#1 NA HYALUR & NA CHOND-NA HYALUR IO KIT
PACK | INTRAOCULAR | Status: DC | PRN
  Administered 2022-07-24: 1 via OPHTHALMIC

## 2022-07-24 MED ORDER — FENTANYL CITRATE (PF) 100 MCG/2ML IJ SOLN
INTRAMUSCULAR | Status: DC | PRN
  Administered 2022-07-24: 100 ug via INTRAVENOUS

## 2022-07-24 MED ORDER — SIGHTPATH DOSE#1 BSS IO SOLN
INTRAOCULAR | Status: DC | PRN
  Administered 2022-07-24: 39 mL via OPHTHALMIC

## 2022-07-24 SURGICAL SUPPLY — 23 items
CANNULA ANT/CHMB 27G (MISCELLANEOUS) IMPLANT
CANNULA ANT/CHMB 27GA (MISCELLANEOUS) IMPLANT
CATARACT SUITE SIGHTPATH (MISCELLANEOUS) ×2 IMPLANT
FEE CATARACT SUITE SIGHTPATH (MISCELLANEOUS) ×1 IMPLANT
GLOVE SRG 8 PF TXTR STRL LF DI (GLOVE) ×1 IMPLANT
GLOVE SURG ENC TEXT LTX SZ7.5 (GLOVE) ×2 IMPLANT
GLOVE SURG GAMMEX PI TX LF 7.5 (GLOVE) IMPLANT
GLOVE SURG UNDER POLY LF SZ8 (GLOVE) ×2
LENS IOL EYHANCE TORIC II 19.0 ×2 IMPLANT
LENS IOL EYHANCE TRC 225 19.0 IMPLANT
LENS IOL EYHNC TORIC 225 19.0 ×1 IMPLANT
NDL FILTER BLUNT 18X1 1/2 (NEEDLE) ×1 IMPLANT
NDL RETROBULBAR .5 NSTRL (NEEDLE) IMPLANT
NEEDLE FILTER BLUNT 18X 1/2SAF (NEEDLE) ×1
NEEDLE FILTER BLUNT 18X1 1/2 (NEEDLE) ×1 IMPLANT
PACK VIT ANT 23G (MISCELLANEOUS) IMPLANT
RING MALYGIN 7.0 (MISCELLANEOUS) IMPLANT
SUT ETHILON 10-0 CS-B-6CS-B-6 (SUTURE)
SUT VICRYL  9 0 (SUTURE)
SUT VICRYL 9 0 (SUTURE) IMPLANT
SUTURE EHLN 10-0 CS-B-6CS-B-6 (SUTURE) IMPLANT
SYR 3ML LL SCALE MARK (SYRINGE) ×2 IMPLANT
WATER STERILE IRR 250ML POUR (IV SOLUTION) ×2 IMPLANT

## 2022-07-24 NOTE — H&P (Signed)
[facility]   Primary Care Physician:  Margarita Mail, DO Ophthalmologist: Dr. Lockie Mola  Pre-Procedure History & Physical: HPI:  Makayla Wade is a 71 y.o. female here for ophthalmic surgery.   Past Medical History:  Diagnosis Date   Anxiety    Depression    Hx.   Elevated blood pressure reading    Hemorrhoids    Hx.   Hypertension    Migraine headache    Multiple sclerosis (HCC)    Optic neuritis, right    Osteoporosis    Seizures (HCC)    Hx.    Syncope    Tonic seizures (HCC)    prior to 1984, when started on meds for MS   Ulcerative colitis [facility])     Past Surgical History:  Procedure Laterality Date   BREAST LUMPECTOMY     CESAREAN SECTION     FOOT SURGERY     INCONTINENCE SURGERY     OVARIAN CYST SURGERY      Prior to Admission medications   Medication Sig Start Date End Date Taking? Authorizing Provider  Ascorbic Acid (VITAMIN C) 100 MG tablet Take by mouth. 07/18/20  Yes [provider]  baclofen (LIORESAL) 10 MG tablet Take 1 tablet (10 mg total) by mouth 2 (two) times daily. 12/13/21  Yes Glean Salvo, NP  Calcium Carbonate-Vitamin D 600-400 MG-UNIT tablet Take 1 tablet by mouth daily.   Yes [provider]  cholecalciferol (VITAMIN D) 1000 UNITS tablet Take 1,000 Units by mouth daily.   Yes [provider]  clonazePAM (KLONOPIN) 0.5 MG tablet TAKE ONE TABLET BY MOUTH TWICE DAILY 04/03/22  Yes Sater, Pearletha Furl, MD  Cyanocobalamin (VITAMIN B 12 PO) Take by mouth daily.   Yes [provider]  Multiple Vitamins-Minerals (CENTRUM SILVER ADULT 50+ PO) Take 1 tablet by mouth daily.   Yes [provider]  rizatriptan (MAXALT-MLT) 5 MG disintegrating tablet Take 1 tablet (5 mg total) by mouth as needed for migraine. May repeat in 2 hours if needed 05/01/22  Yes Sater, Pearletha Furl, MD    Allergies as of 06/13/2022 - Review Complete 05/01/2022  Allergen Reaction Noted   Sulfa antibiotics Other (See  Comments) 01/27/2012   Copper-containing compounds  02/01/2016    Family History  Problem Relation Age of Onset   Cancer Mother    Heart attack Father    Cancer Father    Cancer Maternal Grandmother    Cancer Paternal Grandfather     Social History   Socioeconomic History   Marital status: Divorced    Spouse name: Not on file   Number of children: 3   Years of education: HS   Highest education level: Not on file  Occupational History   Occupation: Information systems manager    Comment: [employer] [place]  Tobacco Use   Smoking status: Former    Types: Cigarettes    Quit date: 07/09/2021    Years since quitting: 1.0   Smokeless tobacco: Never  Vaping Use   Vaping Use: Never used  Substance and Sexual Activity   Alcohol use: Yes    Alcohol/week: 14.0 standard drinks of alcohol    Types: 14 Glasses of wine per week    Comment: Consumes 2 glasses of wine daily about    Drug use: No   Sexual activity: Not Currently  Other Topics Concern   Not on file  Social History Narrative   Patient is right handed,resides alone  Patient drinks about 1-2 cups of caffeine daily.    Social Determinants of Health   Financial Resource Strain: Not on file  Food Insecurity: Not on file  Transportation Needs: Not on file  Physical Activity: Not on file  Stress: Not on file  Social Connections: Not on file  Intimate Partner Violence: Not on file    Review of Systems: See HPI, otherwise negative ROS  Physical Exam: BP (!) 166/87   Pulse 66   Temp (!) 97 F (36.1 C) (Temporal)   Resp 18   Ht 4\' 11"  (1.499 m)   Wt 53.1 kg   SpO2 97%   BMI 23.63 kg/m  General:   Alert,  pleasant and cooperative in NAD Head:  Normocephalic and atraumatic. Lungs:  Clear to auscultation.    Heart:  Regular rate and rhythm.   Impression/Plan: Makayla Wade is here for ophthalmic surgery.  Risks, benefits, limitations, and alternatives regarding ophthalmic surgery have been  reviewed with the patient.  Questions have been answered.  All parties agreeable.   Axel Filler, MD  07/24/2022, 7:32 AM

## 2022-07-24 NOTE — Anesthesia Preprocedure Evaluation (Signed)
Anesthesia Evaluation  Patient identified by MRN, date of birth, ID band Patient awake    Reviewed: Allergy & Precautions, H&P , NPO status , Patient's Chart, lab work & pertinent test results, reviewed documented beta blocker date and time   History of Anesthesia Complications Negative for: history of anesthetic complications  Airway Mallampati: I  TM Distance: >3 FB Neck ROM: full    Dental  (+) Caps, Teeth Intact, Dental Advidsory Given   Pulmonary neg shortness of breath, neg COPD, neg recent URI, former smoker,    Pulmonary exam normal breath sounds clear to auscultation       Cardiovascular Exercise Tolerance: Good hypertension, (-) angina(-) Past MI and (-) Cardiac Stents Normal cardiovascular exam(-) dysrhythmias (-) Valvular Problems/Murmurs Rhythm:regular Rate:Normal     Neuro/Psych  Headaches, Seizures -, Well Controlled,  PSYCHIATRIC DISORDERS Anxiety Depression    GI/Hepatic Neg liver ROS, hiatal hernia, PUD, neg GERD  ,  Endo/Other  negative endocrine ROS  Renal/GU negative Renal ROS  negative genitourinary   Musculoskeletal   Abdominal   Peds  Hematology negative hematology ROS (+)   Anesthesia Other Findings Past Medical History: No date: Anxiety No date: Depression     Comment:  Hx. No date: Elevated blood pressure reading No date: Hemorrhoids     Comment:  Hx. No date: Hypertension No date: Migraine headache No date: Multiple sclerosis (HCC) No date: Optic neuritis, right No date: Osteoporosis No date: Seizures (HCC)     Comment:  Hx.  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[place])     Comment:  prior to 1984, when started on meds for MS No date: Ulcerative colitis ([place])   Reproductive/Obstetrics negative OB ROS                             Anesthesia Physical Anesthesia Plan  ASA: 2  Anesthesia Plan: MAC   Post-op Pain Management:    Induction:  Intravenous  PONV Risk Score and Plan: 2 and Ondansetron, Midazolam and Treatment may vary due to age or medical condition  Airway Management Planned: Natural Airway and Nasal Cannula  Additional Equipment:   Intra-op Plan:   Post-operative Plan:   Informed Consent: I have reviewed the patients History and Physical, chart, labs and discussed the procedure including the risks, benefits and alternatives for the proposed anesthesia with the patient or authorized representative who has indicated his/her understanding and acceptance.     Dental Advisory Given  Plan Discussed with: Anesthesiologist, CRNA and Surgeon  Anesthesia Plan Comments:         Anesthesia Quick Evaluation

## 2022-07-24 NOTE — Op Note (Signed)
LOCATION:  [facility]   PREOPERATIVE DIAGNOSIS:  Nuclear sclerotic cataract of the left eye.  H25.12  POSTOPERATIVE DIAGNOSIS:  Nuclear sclerotic cataract of the left eye.   PROCEDURE:  Phacoemulsification with Toric posterior chamber intraocular lens placement of the left eye.  Ultrasound time: Procedure(s) with comments: CATARACT EXTRACTION PHACO AND INTRAOCULAR LENS PLACEMENT (IOC) LEFT  TORIC LENS (Left) - 4.92 0:41.8  LENS:   Implant Name Type Inv. Item Serial No. Manufacturer Lot No. LRB No. Used Action  LENS IOL EYHANCE TORIC II 19.0 - [id number]  LENS IOL EYHANCE TORIC II 19.0 [id number] [employer]  Left 1 Implanted     [id number] Toric intraocular lens with 2.25 diopters of cylindrical power with axis orientation at 172 degrees.     SURGEON:  Deirdre Evener, MD   ANESTHESIA:  Topical with tetracaine drops and 2% Xylocaine jelly, augmented with 1% preservative-free intracameral lidocaine.  COMPLICATIONS:  None.   DESCRIPTION OF PROCEDURE:  The patient was identified in the holding room and transported to the operating suite and placed in the supine position under the operating microscope.  The left eye was identified as the operative eye, and it was prepped and draped in the usual sterile ophthalmic fashion.    A clear-corneal paracentesis incision was made at the 1:30 position.  0.5 ml of preservative-free 1% lidocaine was injected into the anterior chamber. The anterior chamber was filled with Viscoat.  A 2.4 millimeter near clear corneal incision was then made at the 10:30 position.  A cystotome and capsulorrhexis forceps were then used to make a curvilinear capsulorrhexis.  Hydrodissection and hydrodelineation were then performed using balanced salt solution.   Phacoemulsification was then used in stop and chop fashion to remove the lens, nucleus and epinucleus.  The remaining cortex was aspirated using the irrigation and aspiration handpiece.  Provisc  viscoelastic was then placed into the capsular bag to distend it for lens placement.  The Verion digital marker was used to align the implant at the intended axis.   A Toric lens was then injected into the capsular bag.  It was rotated clockwise until the axis marks on the lens were approximately 15 degrees in the counterclockwise direction to the intended alignment.  The viscoelastic was aspirated from the eye using the irrigation aspiration handpiece.  Then, a Koch spatula through the sideport incision was used to rotate the lens in a clockwise direction until the axis markings of the intraocular lens were lined up with the Verion alignment.  Balanced salt solution was then used to hydrate the wounds. Cefuroxime 0.1 ml of a 10mg /ml solution was injected into the anterior chamber for a dose of 1 mg of intracameral antibiotic at the completion of the case.    The eye was noted to have a physiologic pressure and there was no wound leak noted.   Timolol and Brimonidine drops were applied to the eye.  The patient was taken to the recovery room in stable condition having had no complications of anesthesia or surgery.  Greogry Goodwyn 07/24/2022, 8:29 AM

## 2022-07-24 NOTE — Telephone Encounter (Signed)
Dr Epimenio Foot has this medication pending to be approved and sent in for the patient. I should be sent today for her. I will let him know she is asking

## 2022-07-24 NOTE — Telephone Encounter (Signed)
Pt is calling and said her refill request was called in on Monday for clonazePAM (KLONOPIN) 0.5 MG tablet. Pt said she really needs  this medication today. Pt is requesting prescriptions be sent to [facility] [place].

## 2022-07-24 NOTE — Anesthesia Postprocedure Evaluation (Signed)
Anesthesia Post Note  Patient: Makayla Wade  Procedure(s) Performed: CATARACT EXTRACTION PHACO AND INTRAOCULAR LENS PLACEMENT (IOC) LEFT  TORIC LENS (Left: Eye)     Patient location during evaluation: PACU Anesthesia Type: MAC Level of consciousness: awake and alert Pain management: pain level controlled Vital Signs Assessment: post-procedure vital signs reviewed and stable Respiratory status: spontaneous breathing, nonlabored ventilation, respiratory function stable and patient connected to nasal cannula oxygen Cardiovascular status: stable and blood pressure returned to baseline Postop Assessment: no apparent nausea or vomiting Anesthetic complications: no   There were no known notable events for this encounter.  Martha Clan

## 2022-07-24 NOTE — Transfer of Care (Signed)
Immediate Anesthesia Transfer of Care Note  Patient: [place]  Procedure(s) Performed: CATARACT EXTRACTION PHACO AND INTRAOCULAR LENS PLACEMENT (IOC) LEFT  TORIC LENS (Left: Eye)  Patient Location: PACU  Anesthesia Type: MAC  Level of Consciousness: awake, alert  and patient cooperative  Airway and Oxygen Therapy: Patient Spontanous Breathing and Patient connected to supplemental oxygen  Post-op Assessment: Post-op Vital signs reviewed, Patient's Cardiovascular Status Stable, Respiratory Function Stable, Patent Airway and No signs of Nausea or vomiting  Post-op Vital Signs: Reviewed and stable  Complications: There were no known notable events for this encounter.

## 2022-07-25 ENCOUNTER — Other Ambulatory Visit: Payer: Self-pay

## 2022-07-25 ENCOUNTER — Encounter: Payer: Self-pay | Admitting: Ophthalmology

## 2022-07-26 DIAGNOSIS — N390 Urinary tract infection, site not specified: Secondary | ICD-10-CM | POA: Diagnosis not present

## 2022-07-26 DIAGNOSIS — R350 Frequency of micturition: Secondary | ICD-10-CM | POA: Diagnosis not present

## 2022-08-02 DIAGNOSIS — H2511 Age-related nuclear cataract, right eye: Secondary | ICD-10-CM | POA: Diagnosis not present

## 2022-08-05 NOTE — Discharge Instructions (Signed)

## 2022-08-07 ENCOUNTER — Encounter: Payer: Self-pay | Admitting: Ophthalmology

## 2022-08-07 ENCOUNTER — Ambulatory Visit (AMBULATORY_SURGERY_CENTER): Payer: 59 | Admitting: General Practice

## 2022-08-07 ENCOUNTER — Ambulatory Visit
Admission: RE | Admit: 2022-08-07 | Discharge: 2022-08-07 | Disposition: A | Discharge status: Home / Self Care | Payer: 59 | Source: Ambulatory Visit | Attending: Ophthalmology | Admitting: Ophthalmology

## 2022-08-07 ENCOUNTER — Ambulatory Visit: Payer: 59 | Admitting: General Practice

## 2022-08-07 ENCOUNTER — Other Ambulatory Visit: Payer: Self-pay

## 2022-08-07 ENCOUNTER — Encounter
Admission: RE | Disposition: A | Discharge status: Home / Self Care | Payer: Self-pay | Source: Ambulatory Visit | Attending: Ophthalmology

## 2022-08-07 DIAGNOSIS — H2511 Age-related nuclear cataract, right eye: Secondary | ICD-10-CM | POA: Insufficient documentation

## 2022-08-07 DIAGNOSIS — I10 Essential (primary) hypertension: Secondary | ICD-10-CM | POA: Diagnosis not present

## 2022-08-07 DIAGNOSIS — R569 Unspecified convulsions: Secondary | ICD-10-CM | POA: Insufficient documentation

## 2022-08-07 DIAGNOSIS — G35 Multiple sclerosis: Secondary | ICD-10-CM | POA: Insufficient documentation

## 2022-08-07 DIAGNOSIS — F418 Other specified anxiety disorders: Secondary | ICD-10-CM | POA: Insufficient documentation

## 2022-08-07 DIAGNOSIS — G43909 Migraine, unspecified, not intractable, without status migrainosus: Secondary | ICD-10-CM | POA: Insufficient documentation

## 2022-08-07 DIAGNOSIS — Z87891 Personal history of nicotine dependence: Secondary | ICD-10-CM | POA: Diagnosis not present

## 2022-08-07 HISTORY — PX: CATARACT EXTRACTION W/PHACO: SHX586

## 2022-08-07 SURGERY — PHACOEMULSIFICATION, CATARACT, WITH IOL INSERTION
Anesthesia: Monitor Anesthesia Care | Site: Eye | Laterality: Right

## 2022-08-07 MED ORDER — SIGHTPATH DOSE#1 NA HYALUR & NA CHOND-NA HYALUR IO KIT
PACK | INTRAOCULAR | Status: DC | PRN
  Administered 2022-08-07: 1 via OPHTHALMIC

## 2022-08-07 MED ORDER — LACTATED RINGERS IV SOLN: INTRAVENOUS | Status: DC

## 2022-08-07 MED ORDER — TETRACAINE HCL 0.5 % OP SOLN
1.0000 [drp] | OPHTHALMIC | Status: DC | PRN
  Administered 2022-08-07 (×3): 1 [drp] via OPHTHALMIC

## 2022-08-07 MED ORDER — BRIMONIDINE TARTRATE-TIMOLOL 0.2-0.5 % OP SOLN
OPHTHALMIC | Status: DC | PRN
  Administered 2022-08-07: 1 [drp] via OPHTHALMIC

## 2022-08-07 MED ORDER — SIGHTPATH DOSE#1 BSS IO SOLN
INTRAOCULAR | Status: DC | PRN
  Administered 2022-08-07: 15 mL

## 2022-08-07 MED ORDER — FENTANYL CITRATE (PF) 100 MCG/2ML IJ SOLN
INTRAMUSCULAR | Status: DC | PRN
Start: 2022-08-07 — End: 2022-08-07
  Administered 2022-08-07: 100 ug via INTRAVENOUS

## 2022-08-07 MED ORDER — SIGHTPATH DOSE#1 BSS IO SOLN
INTRAOCULAR | Status: DC | PRN
  Administered 2022-08-07: 1 mL via INTRAMUSCULAR

## 2022-08-07 MED ORDER — ARMC OPHTHALMIC DILATING DROPS
1.0000 | OPHTHALMIC | Status: DC | PRN
  Administered 2022-08-07 (×3): 1 via OPHTHALMIC

## 2022-08-07 MED ORDER — SIGHTPATH DOSE#1 BSS IO SOLN
INTRAOCULAR | Status: DC | PRN
  Administered 2022-08-07: 53 mL via OPHTHALMIC

## 2022-08-07 MED ORDER — MIDAZOLAM HCL 2 MG/2ML IJ SOLN
INTRAMUSCULAR | Status: DC | PRN
  Administered 2022-08-07: 1 mg via INTRAVENOUS

## 2022-08-07 MED ORDER — CEFUROXIME OPHTHALMIC INJECTION 1 MG/0.1 ML
INJECTION | OPHTHALMIC | Status: DC | PRN
  Administered 2022-08-07: 0.1 mL via INTRACAMERAL

## 2022-08-07 SURGICAL SUPPLY — 13 items
CATARACT SUITE SIGHTPATH (MISCELLANEOUS) ×1 IMPLANT
FEE CATARACT SUITE SIGHTPATH (MISCELLANEOUS) ×1 IMPLANT
GLOVE SRG 8 PF TXTR STRL LF DI (GLOVE) ×1 IMPLANT
GLOVE SURG ENC TEXT LTX SZ7.5 (GLOVE) ×1 IMPLANT
GLOVE SURG UNDER POLY LF SZ8 (GLOVE) ×1
LENS IOL EYHANCE TORIC II 18.5 ×1 IMPLANT
LENS IOL EYHANCE TRC 225 18.5 IMPLANT
LENS IOL EYHNC TORIC 225 18.5 ×1 IMPLANT
NDL FILTER BLUNT 18X1 1/2 (NEEDLE) ×1 IMPLANT
NEEDLE FILTER BLUNT 18X 1/2SAF (NEEDLE) ×1
NEEDLE FILTER BLUNT 18X1 1/2 (NEEDLE) ×1 IMPLANT
SYR 3ML LL SCALE MARK (SYRINGE) ×1 IMPLANT
WATER STERILE IRR 250ML POUR (IV SOLUTION) ×1 IMPLANT

## 2022-08-07 NOTE — Anesthesia Preprocedure Evaluation (Signed)
Anesthesia Evaluation  Patient identified by MRN, date of birth, ID band Patient awake    Reviewed: Allergy & Precautions, H&P , NPO status , Patient's Chart, lab work & pertinent test results, reviewed documented beta blocker date and time   History of Anesthesia Complications Negative for: history of anesthetic complications  Airway Mallampati: I  TM Distance: >3 FB Neck ROM: full    Dental  (+) Caps, Teeth Intact, Dental Advidsory Given   Pulmonary neg shortness of breath, neg COPD, neg recent URI, former smoker,    Pulmonary exam normal breath sounds clear to auscultation       Cardiovascular Exercise Tolerance: Good hypertension, (-) angina(-) Past MI and (-) Cardiac Stents Normal cardiovascular exam(-) dysrhythmias (-) Valvular Problems/Murmurs Rhythm:regular Rate:Normal     Neuro/Psych  Headaches, Seizures -, Well Controlled,  PSYCHIATRIC DISORDERS Anxiety Depression    GI/Hepatic Neg liver ROS, hiatal hernia, PUD, neg GERD  ,  Endo/Other  negative endocrine ROS  Renal/GU negative Renal ROS  negative genitourinary   Musculoskeletal   Abdominal   Peds  Hematology negative hematology ROS (+)   Anesthesia Other Findings Past Medical History: No date: Anxiety No date: Depression     Comment:  Hx. No date: Elevated blood pressure reading No date: Hemorrhoids     Comment:  Hx. No date: Hypertension No date: Migraine headache No date: Multiple sclerosis (HCC) No date: Optic neuritis, right No date: Osteoporosis No date: Seizures (HCC)     Comment:  Hx.  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[place])     Comment:  prior to 1984, when started on meds for MS No date: Ulcerative colitis ([place])   Reproductive/Obstetrics negative OB ROS                             Anesthesia Physical  Anesthesia Plan  ASA: 2  Anesthesia Plan: MAC   Post-op Pain Management:    Induction:  Intravenous  PONV Risk Score and Plan: 2 and Ondansetron, Midazolam and Treatment may vary due to age or medical condition  Airway Management Planned: Natural Airway and Nasal Cannula  Additional Equipment:   Intra-op Plan:   Post-operative Plan:   Informed Consent: I have reviewed the patients History and Physical, chart, labs and discussed the procedure including the risks, benefits and alternatives for the proposed anesthesia with the patient or authorized representative who has indicated his/her understanding and acceptance.     Dental Advisory Given  Plan Discussed with: Anesthesiologist, CRNA and Surgeon  Anesthesia Plan Comments:         Anesthesia Quick Evaluation

## 2022-08-07 NOTE — Op Note (Signed)
  LOCATION:  [facility]   PREOPERATIVE DIAGNOSIS:  Nuclear sclerotic cataract of the right eye.  H25.11   POSTOPERATIVE DIAGNOSIS:  Nuclear sclerotic cataract of the right eye.   PROCEDURE:  Phacoemulsification with Toric posterior chamber intraocular lens placement of the right eye.  Ultrasound time: Procedure(s): CATARACT EXTRACTION PHACO AND INTRAOCULAR LENS PLACEMENT (IOC) RIGHT  TORIC LENS 7.13 01:03.0 (Right)  LENS:   Implant Name Type Inv. Item Serial No. Manufacturer Lot No. LRB No. Used Action  LENS IOL EYHANCE TORIC II 18.5 - [id number]  LENS IOL EYHANCE TORIC II 18.5 [id number] [employer]  Right 1 Implanted     [id number] Toric intraocular lens with 2.25 diopters of cylindrical power with axis orientation at 69 degrees.   SURGEON:  Deirdre Evener, MD   ANESTHESIA: Topical with tetracaine drops and 2% Xylocaine jelly, augmented with 1% preservative-free intracameral lidocaine. .   COMPLICATIONS:  None.   DESCRIPTION OF PROCEDURE:  The patient was identified in the holding room and transported to the operating suite and placed in the supine position under the operating microscope.  The right eye was identified as the operative eye, and it was prepped and draped in the usual sterile ophthalmic fashion.    A clear-corneal paracentesis incision was made at the 12:00 position.  0.5 ml of preservative-free 1% lidocaine was injected into the anterior chamber. The anterior chamber was filled with Viscoat.  A 2.4 millimeter near clear corneal incision was then made at the 9:00 position.  A cystotome and capsulorrhexis forceps were then used to make a curvilinear capsulorrhexis.  Hydrodissection and hydrodelineation were then performed using balanced salt solution.   Phacoemulsification was then used in stop and chop fashion to remove the lens, nucleus and epinucleus.  The remaining cortex was aspirated using the irrigation and aspiration handpiece.  Provisc viscoelastic was  then placed into the capsular bag to distend it for lens placement.  The Verion digital marker was used to align the implant at the intended axis.   A Toric lens was then injected into the capsular bag.  It was rotated clockwise until the axis marks on the lens were approximately 15 degrees in the counterclockwise direction to the intended alignment.  The viscoelastic was aspirated from the eye using the irrigation aspiration handpiece.  Then, a Koch spatula through the sideport incision was used to rotate the lens in a clockwise direction until the axis markings of the intraocular lens were lined up with the Verion alignment.  Balanced salt solution was then used to hydrate the wounds. Cefuroxime 0.1 ml of a 10mg /ml solution was injected into the anterior chamber for a dose of 1 mg of intracameral antibiotic at the completion of the case.    The eye was noted to have a physiologic pressure and there was no wound leak noted.   Timolol and Brimonidine drops were applied to the eye.  The patient was taken to the recovery room in stable condition having had no complications of anesthesia or surgery.  Makayla Wade 08/07/2022, 1:01 PM

## 2022-08-07 NOTE — Anesthesia Postprocedure Evaluation (Signed)
Anesthesia Post Note  Patient: Makayla Wade  Procedure(s) Performed: CATARACT EXTRACTION PHACO AND INTRAOCULAR LENS PLACEMENT (IOC) RIGHT  TORIC LENS 7.13 01:03.0 (Right: Eye)     Patient location during evaluation: PACU Anesthesia Type: MAC Level of consciousness: awake and alert Pain management: pain level controlled Vital Signs Assessment: post-procedure vital signs reviewed and stable Respiratory status: spontaneous breathing, nonlabored ventilation, respiratory function stable and patient connected to nasal cannula oxygen Cardiovascular status: stable and blood pressure returned to baseline Postop Assessment: no apparent nausea or vomiting Anesthetic complications: no   There were no known notable events for this encounter.  Martha Clan

## 2022-08-07 NOTE — Transfer of Care (Signed)
Immediate Anesthesia Transfer of Care Note  Patient: Makayla Wade  Procedure(s) Performed: CATARACT EXTRACTION PHACO AND INTRAOCULAR LENS PLACEMENT (IOC) RIGHT  TORIC LENS 7.13 01:03.0 (Right: Eye)  Patient Location: PACU  Anesthesia Type: MAC  Level of Consciousness: awake, alert  and patient cooperative  Airway and Oxygen Therapy: Patient Spontanous Breathing and Patient connected to supplemental oxygen  Post-op Assessment: Post-op Vital signs reviewed, Patient's Cardiovascular Status Stable, Respiratory Function Stable, Patent Airway and No signs of Nausea or vomiting  Post-op Vital Signs: Reviewed and stable  Complications: There were no known notable events for this encounter.

## 2022-08-07 NOTE — H&P (Signed)
[facility]   Primary Care Physician:  Margarita Mail, DO Ophthalmologist: Dr. Lockie Mola  Pre-Procedure History & Physical: HPI:  Makayla Wade is a 71 y.o. female here for ophthalmic surgery.   Past Medical History:  Diagnosis Date   Anxiety    Depression    Hx.   Elevated blood pressure reading    Hemorrhoids    Hx.   Hypertension    Migraine headache    Multiple sclerosis (HCC)    Optic neuritis, right    Osteoporosis    Seizures (HCC)    Hx.    Syncope    Tonic seizures (HCC)    prior to 1984, when started on meds for MS   Ulcerative colitis [facility])     Past Surgical History:  Procedure Laterality Date   BREAST LUMPECTOMY     CATARACT EXTRACTION W/PHACO Left 07/24/2022   Procedure: CATARACT EXTRACTION PHACO AND INTRAOCULAR LENS PLACEMENT (IOC) LEFT  TORIC LENS;  Surgeon: Lockie Mola, MD;  Location: [facility];  Service: Ophthalmology;  Laterality: Left;  4.92 0:41.8   CESAREAN SECTION     FOOT SURGERY     INCONTINENCE SURGERY     OVARIAN CYST SURGERY      Prior to Admission medications   Medication Sig Start Date End Date Taking? Authorizing Provider  Ascorbic Acid (VITAMIN C) 100 MG tablet Take by mouth. 07/18/20  Yes [provider]  baclofen (LIORESAL) 10 MG tablet Take 1 tablet (10 mg total) by mouth 2 (two) times daily. 12/13/21  Yes Glean Salvo, NP  Calcium Carbonate-Vitamin D 600-400 MG-UNIT tablet Take 1 tablet by mouth daily.   Yes [provider]  cholecalciferol (VITAMIN D) 1000 UNITS tablet Take 1,000 Units by mouth daily.   Yes [provider]  clonazePAM (KLONOPIN) 0.5 MG tablet TAKE ONE TABLET BY MOUTH TWICE DAILY 07/24/22  Yes Sater, Pearletha Furl, MD  Cyanocobalamin (VITAMIN B 12 PO) Take by mouth daily.   Yes [provider]  Multiple Vitamins-Minerals (CENTRUM SILVER ADULT 50+ PO) Take 1 tablet by mouth daily.   Yes [provider]  rizatriptan (MAXALT-MLT) 5 MG  disintegrating tablet Take 1 tablet (5 mg total) by mouth as needed for migraine. May repeat in 2 hours if needed 05/01/22  Yes Sater, Pearletha Furl, MD    Allergies as of 06/13/2022 - Review Complete 05/01/2022  Allergen Reaction Noted   Sulfa antibiotics Other (See Comments) 01/27/2012   Copper-containing compounds  02/01/2016    Family History  Problem Relation Age of Onset   Cancer Mother    Heart attack Father    Cancer Father    Cancer Maternal Grandmother    Cancer Paternal Grandfather     Social History   Socioeconomic History   Marital status: Divorced    Spouse name: Not on file   Number of children: 3   Years of education: HS   Highest education level: Not on file  Occupational History   Occupation: Information systems manager    Comment: [employer]  Tobacco Use   Smoking status: Former    Types: Cigarettes    Quit date: 07/09/2021    Years since quitting: 1.0   Smokeless tobacco: Never  Vaping Use   Vaping Use: Never used  Substance and Sexual Activity   Alcohol use: Yes    Alcohol/week: 14.0 standard drinks of alcohol    Types: 14 Glasses of wine per week    Comment: Consumes  2 glasses of wine daily about    Drug use: No   Sexual activity: Not Currently  Other Topics Concern   Not on file  Social History Narrative   Patient is right handed,resides alone      Patient drinks about 1-2 cups of caffeine daily.    Social Determinants of Health   Financial Resource Strain: Not on file  Food Insecurity: Not on file  Transportation Needs: Not on file  Physical Activity: Not on file  Stress: Not on file  Social Connections: Not on file  Intimate Partner Violence: Not on file    Review of Systems: See HPI, otherwise negative ROS  Physical Exam: BP (!) 162/83   Pulse 68   Temp (!) 97.5 F (36.4 C) (Temporal)   Resp 16   Ht 4\' 11"  (1.499 m)   Wt 52.2 kg   SpO2 98%   BMI 23.23 kg/m  General:   Alert,  pleasant and cooperative in  NAD Head:  Normocephalic and atraumatic. Lungs:  Clear to auscultation.    Heart:  Regular rate and rhythm.   Impression/Plan: Makayla Wade is here for ophthalmic surgery.  Risks, benefits, limitations, and alternatives regarding ophthalmic surgery have been reviewed with the patient.  Questions have been answered.  All parties agreeable.   Axel Filler, MD  08/07/2022, 12:25 PM

## 2022-08-08 ENCOUNTER — Encounter: Payer: Self-pay | Admitting: Ophthalmology

## 2022-08-15 ENCOUNTER — Emergency Department: Payer: 59

## 2022-08-15 ENCOUNTER — Other Ambulatory Visit: Payer: Self-pay

## 2022-08-15 ENCOUNTER — Emergency Department
Admission: EM | Admit: 2022-08-15 | Discharge: 2022-08-15 | Disposition: A | Discharge status: Home / Self Care | Payer: 59 | Attending: Emergency Medicine | Admitting: Emergency Medicine

## 2022-08-15 DIAGNOSIS — S3991XA Unspecified injury of abdomen, initial encounter: Secondary | ICD-10-CM | POA: Diagnosis present

## 2022-08-15 DIAGNOSIS — I10 Essential (primary) hypertension: Secondary | ICD-10-CM | POA: Diagnosis not present

## 2022-08-15 DIAGNOSIS — S301XXA Contusion of abdominal wall, initial encounter: Secondary | ICD-10-CM | POA: Diagnosis not present

## 2022-08-15 DIAGNOSIS — Z041 Encounter for examination and observation following transport accident: Secondary | ICD-10-CM | POA: Diagnosis not present

## 2022-08-15 DIAGNOSIS — Y92410 Unspecified street and highway as the place of occurrence of the external cause: Secondary | ICD-10-CM | POA: Diagnosis not present

## 2022-08-15 DIAGNOSIS — I700 Atherosclerosis of aorta: Secondary | ICD-10-CM | POA: Insufficient documentation

## 2022-08-15 DIAGNOSIS — K760 Fatty (change of) liver, not elsewhere classified: Secondary | ICD-10-CM | POA: Diagnosis not present

## 2022-08-15 DIAGNOSIS — K5730 Diverticulosis of large intestine without perforation or abscess without bleeding: Secondary | ICD-10-CM | POA: Diagnosis not present

## 2022-08-15 LAB — CBC WITH DIFFERENTIAL/PLATELET
Abs Immature Granulocytes: 0.03 10*3/uL (ref 0.00–0.07)
Basophils Absolute: 0.1 10*3/uL (ref 0.0–0.1)
Basophils Relative: 1 %
Eosinophils Absolute: 0.1 10*3/uL (ref 0.0–0.5)
Eosinophils Relative: 1 %
HCT: 43.9 % (ref 36.0–46.0)
Hemoglobin: 14.3 g/dL (ref 12.0–15.0)
Immature Granulocytes: 0 %
Lymphocytes Relative: 16 %
Lymphs Abs: 1.2 10*3/uL (ref 0.7–4.0)
MCH: 31.8 pg (ref 26.0–34.0)
MCHC: 32.6 g/dL (ref 30.0–36.0)
MCV: 97.8 fL (ref 80.0–100.0)
Monocytes Absolute: 0.5 10*3/uL (ref 0.1–1.0)
Monocytes Relative: 7 %
Neutro Abs: 5.4 10*3/uL (ref 1.7–7.7)
Neutrophils Relative %: 75 %
Platelets: 210 10*3/uL (ref 150–400)
RBC: 4.49 MIL/uL (ref 3.87–5.11)
RDW: 12.9 % (ref 11.5–15.5)
WBC: 7.3 10*3/uL (ref 4.0–10.5)
nRBC: 0 % (ref 0.0–0.2)

## 2022-08-15 LAB — URINALYSIS, COMPLETE (UACMP) WITH MICROSCOPIC
Bilirubin Urine: NEGATIVE
Glucose, UA: NEGATIVE mg/dL
Hgb urine dipstick: NEGATIVE
Ketones, ur: NEGATIVE mg/dL
Leukocytes,Ua: NEGATIVE
Nitrite: NEGATIVE
Protein, ur: NEGATIVE mg/dL
Specific Gravity, Urine: 1.011 (ref 1.005–1.030)
pH: 7 (ref 5.0–8.0)

## 2022-08-15 LAB — COMPREHENSIVE METABOLIC PANEL
ALT: 19 U/L (ref 0–44)
AST: 28 U/L (ref 15–41)
Albumin: 4.5 g/dL (ref 3.5–5.0)
Alkaline Phosphatase: 71 U/L (ref 38–126)
Anion gap: 7 (ref 5–15)
BUN: 20 mg/dL (ref 8–23)
CO2: 26 mmol/L (ref 22–32)
Calcium: 9.5 mg/dL (ref 8.9–10.3)
Chloride: 106 mmol/L (ref 98–111)
Creatinine, Ser: 0.67 mg/dL (ref 0.44–1.00)
GFR, Estimated: >60 mL/min (ref >60)
Glucose, Bld: 100 mg/dL — ABNORMAL HIGH (ref 70–99)
Potassium: 4 mmol/L (ref 3.5–5.1)
Sodium: 139 mmol/L (ref 135–145)
Total Bilirubin: 0.6 mg/dL (ref 0.3–1.2)
Total Protein: 7.4 g/dL (ref 6.5–8.1)

## 2022-08-15 MED ORDER — LIDOCAINE 5 % EX PTCH
1.0000 | MEDICATED_PATCH | CUTANEOUS | Status: DC
  Administered 2022-08-15: 1 via TRANSDERMAL
  Filled 2022-08-15: qty 1

## 2022-08-15 MED ORDER — LIDOCAINE 5 % EX PTCH
1.0000 | MEDICATED_PATCH | Freq: Two times a day (BID) | CUTANEOUS | 0 refills | Status: DC
Start: 2022-08-15 — End: 2022-09-18

## 2022-08-15 MED ORDER — IOHEXOL 300 MG/ML  SOLN
100.0000 mL | Freq: Once | INTRAMUSCULAR | Status: AC | PRN
  Administered 2022-08-15: 100 mL via INTRAVENOUS

## 2022-08-15 NOTE — ED Notes (Signed)
ED Provider at bedside. 
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
## 2022-08-17 ENCOUNTER — Other Ambulatory Visit: Payer: Self-pay

## 2022-08-17 ENCOUNTER — Emergency Department
Admission: EM | Admit: 2022-08-17 | Discharge: 2022-08-17 | Disposition: A | Discharge status: Home / Self Care | Payer: 59 | Attending: Emergency Medicine | Admitting: Emergency Medicine

## 2022-08-17 ENCOUNTER — Emergency Department: Payer: 59

## 2022-08-17 DIAGNOSIS — I10 Essential (primary) hypertension: Secondary | ICD-10-CM | POA: Diagnosis not present

## 2022-08-17 DIAGNOSIS — Y92410 Unspecified street and highway as the place of occurrence of the external cause: Secondary | ICD-10-CM | POA: Insufficient documentation

## 2022-08-17 DIAGNOSIS — N390 Urinary tract infection, site not specified: Secondary | ICD-10-CM | POA: Diagnosis not present

## 2022-08-17 DIAGNOSIS — S0990XA Unspecified injury of head, initial encounter: Secondary | ICD-10-CM | POA: Diagnosis not present

## 2022-08-17 DIAGNOSIS — Z85820 Personal history of malignant melanoma of skin: Secondary | ICD-10-CM | POA: Diagnosis not present

## 2022-08-17 DIAGNOSIS — I739 Peripheral vascular disease, unspecified: Secondary | ICD-10-CM | POA: Diagnosis not present

## 2022-08-17 LAB — URINALYSIS, ROUTINE W REFLEX MICROSCOPIC
Bilirubin Urine: NEGATIVE
Glucose, UA: NEGATIVE mg/dL
Ketones, ur: NEGATIVE mg/dL
Nitrite: POSITIVE — AB
Protein, ur: 30 mg/dL — AB
Specific Gravity, Urine: 1.023 (ref 1.005–1.030)
WBC, UA: >50 WBC/hpf — ABNORMAL HIGH (ref 0–5)
pH: 5 (ref 5.0–8.0)

## 2022-08-17 MED ORDER — NITROFURANTOIN MONOHYD MACRO 100 MG PO CAPS: 100.0000 mg | ORAL_CAPSULE | Freq: Two times a day (BID) | ORAL | 0 refills | Status: AC

## 2022-08-17 MED ORDER — NITROFURANTOIN MONOHYD MACRO 100 MG PO CAPS: 100.0000 mg | ORAL_CAPSULE | Freq: Two times a day (BID) | ORAL | 0 refills | Status: DC

## 2022-08-17 MED ORDER — NITROFURANTOIN MONOHYD MACRO 100 MG PO CAPS
100.0000 mg | ORAL_CAPSULE | Freq: Once | ORAL | Status: AC
  Administered 2022-08-17: 100 mg via ORAL
  Filled 2022-08-17: qty 1
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
## 2022-08-17 NOTE — ED Triage Notes (Addendum)
Pt states she was in [facility] Thursday and was seen here after. Pt reports after she went home she noticed a knot on the left side of her head and wants to be evaluated. Swelling noted on left sided of head. Pt is AOX4, no bleeding noted. Pt denies visual changes- c/o mild dull headache. Pt also c/o urinary frequency and frequent UTI's and requests urinalysis.

## 2022-08-17 NOTE — Discharge Instructions (Addendum)
Your CAT scan did not show any injury to your brain.  Your urine sample does look like you have a UTI.  Please take the antibiotic twice a day for the next 5 days.  Please follow-up with your primary care provider.

## 2022-08-17 NOTE — ED Notes (Signed)
Lab Called need to recollect urine
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
## 2022-08-19 ENCOUNTER — Ambulatory Visit: Payer: 59 | Admitting: Internal Medicine

## 2022-08-20 LAB — URINE CULTURE: Culture: >=100000 — AB

## 2022-08-22 DIAGNOSIS — H52203 Unspecified astigmatism, bilateral: Secondary | ICD-10-CM | POA: Diagnosis not present

## 2022-09-06 ENCOUNTER — Ambulatory Visit: Payer: 59 | Admitting: Internal Medicine

## 2022-09-18 ENCOUNTER — Ambulatory Visit (INDEPENDENT_AMBULATORY_CARE_PROVIDER_SITE_OTHER): Payer: 59 | Admitting: Internal Medicine

## 2022-09-18 ENCOUNTER — Encounter: Payer: Self-pay | Admitting: Internal Medicine

## 2022-09-18 VITALS — BP 122/70 | HR 92 | Temp 98.2°F | Resp 16 | Ht 59.0 in | Wt 113.9 lb

## 2022-09-18 DIAGNOSIS — Z23 Encounter for immunization: Secondary | ICD-10-CM | POA: Diagnosis not present

## 2022-09-18 DIAGNOSIS — H9311 Tinnitus, right ear: Secondary | ICD-10-CM | POA: Diagnosis not present

## 2022-09-18 DIAGNOSIS — Z1231 Encounter for screening mammogram for malignant neoplasm of breast: Secondary | ICD-10-CM

## 2022-09-18 DIAGNOSIS — M816 Localized osteoporosis [Lequesne]: Secondary | ICD-10-CM | POA: Diagnosis not present

## 2022-09-18 NOTE — Patient Instructions (Signed)
It was great seeing you today!  Plan discussed at today's visit: -Bone scan and mammogram ordered   Follow up in: 1 month or per patient preference   Take care and let us know if you have any questions or concerns prior to your next visit.  Dr. Rosana Berger
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
## 2022-09-25 ENCOUNTER — Encounter: Payer: Self-pay | Admitting: Internal Medicine

## 2022-10-17 ENCOUNTER — Ambulatory Visit: Payer: 59 | Admitting: Internal Medicine

## 2022-11-08 DIAGNOSIS — Z08 Encounter for follow-up examination after completed treatment for malignant neoplasm: Secondary | ICD-10-CM | POA: Diagnosis not present

## 2022-11-08 DIAGNOSIS — Z85820 Personal history of malignant melanoma of skin: Secondary | ICD-10-CM | POA: Diagnosis not present

## 2022-11-08 DIAGNOSIS — Z86006 Personal history of melanoma in-situ: Secondary | ICD-10-CM | POA: Diagnosis not present

## 2022-11-08 DIAGNOSIS — L821 Other seborrheic keratosis: Secondary | ICD-10-CM | POA: Diagnosis not present

## 2022-11-08 DIAGNOSIS — D485 Neoplasm of uncertain behavior of skin: Secondary | ICD-10-CM | POA: Diagnosis not present

## 2022-11-09 ENCOUNTER — Other Ambulatory Visit: Payer: Self-pay | Admitting: Neurology

## 2022-12-27 ENCOUNTER — Telehealth: Payer: Self-pay

## 2022-12-27 DIAGNOSIS — M8589 Other specified disorders of bone density and structure, multiple sites: Secondary | ICD-10-CM | POA: Diagnosis not present

## 2022-12-27 DIAGNOSIS — Z780 Asymptomatic menopausal state: Secondary | ICD-10-CM | POA: Diagnosis not present

## 2022-12-27 DIAGNOSIS — M810 Age-related osteoporosis without current pathological fracture: Secondary | ICD-10-CM | POA: Diagnosis not present

## 2022-12-27 DIAGNOSIS — Z1231 Encounter for screening mammogram for malignant neoplasm of breast: Secondary | ICD-10-CM | POA: Diagnosis not present

## 2022-12-27 LAB — HM DEXA SCAN

## 2022-12-27 LAB — HM MAMMOGRAPHY

## 2022-12-27 NOTE — Telephone Encounter (Signed)
Presents to [place] clinic requesting to have her left lower leg/ankle evaluated.  C/O swelling & feeling tight.  LLE/ankle swollen, slightly warm to touch.  Denies pain.  States just feels tight.  States she ate at [employer] last night & that their food is saltier than what she normally eats. Also, works sitting on a stool with her legs in dangling.  Advised to contact her PCP to see if someone in the practice can see her today or order an ultrasound for her.  She said she had to be in [place] at 2:00 pm for a Mammogram.  VS:  BP = 149/76; P = 72; O2 = 99%  AMD

## 2023-01-05 NOTE — Progress Notes (Unsigned)
Established Patient Office Visit  Subjective:  Patient ID: Makayla Wade, female    DOB: [birth year]  Age: 72 y.o. MRN: [id number]  CC:  No chief complaint on file.   HPI Makayla Wade presents for follow up.   MS: -Following with Neurology, last seen 05/01/22 - no exacerbation of symptoms in multiple years, last 2 MRI's stable -Is on Baclofen and Klonopin as needed for symptoms  -Diagnosed in 1984 -Last MRI brain in 02/2018 -Has had seizures in the past associated with MS, last seizure at least 30 years ago, not on any medication  -Only current neurologic issue is right-sided tinnitus that has been gradually worsening over the last several months.  She denies bilateral ear pain, tinnitus on the left side, hearing changes or drainage from the ears.  UC: -Diagnosed on a past colonoscopy, no abnormalities other than polyp in 2018 -No symptoms of abdominal cramping, diarrhea or bloody stools -Does have some constipation but no other active issues  Ocular Migraines: -Started on Malxalt per Neurology   Elevated Blood Pressure Readings: -160's at work, had been prescribed Propanolol but made her feel very tired and stopped taking it  -Occasionally feels heart fluttering, no chest pain, no shortness of breath  -Checks blood pressure at home: 112-160/70-80  Health Maintenance: -Blood work UTD -Mammogram 10/22 -DEXA:  -DEXA -05/03/2020 T score right femoral neck -3.0, left femoral neck -2.90, lumbar spine T score -2.30 with statistically significant decrease in spine, bilateral hips stable.  03/2012 lumbar spine -1.9, left femur -2.7, right hip -3.0, left forearm -3.0. Currently on calcium and Vitamin D. Had been Fosamax 70 mg for the last 5 years but not on it currently.  Has been on this medication for 2 years total, does not recall why it was discontinued.  Has not been on any other medication.  -Colon cancer screening: colonoscopy 10/2017, repeat in 5-10 years   Past Medical  History:  Diagnosis Date   Anxiety    Depression    Hx.   Elevated blood pressure reading    Hemorrhoids    Hx.   Hypertension    Migraine headache    Multiple sclerosis (HCC)    Optic neuritis, right    Osteoporosis    Seizures (HCC)    Hx.    Syncope    Tonic seizures ([place])    prior to 1984, when started on meds for MS   Ulcerative colitis [facility])     Past Surgical History:  Procedure Laterality Date   BREAST LUMPECTOMY     CATARACT EXTRACTION W/PHACO Left 07/24/2022   Procedure: CATARACT EXTRACTION PHACO AND INTRAOCULAR LENS PLACEMENT ([place]) LEFT  TORIC LENS;  Surgeon: Makayla Koyanagi, MD;  Location: [place];  Service: Ophthalmology;  Laterality: Left;  4.92 0:41.8   CATARACT EXTRACTION W/PHACO Right 08/07/2022   Procedure: CATARACT EXTRACTION PHACO AND INTRAOCULAR LENS PLACEMENT ([place]) RIGHT  TORIC LENS 7.13 01:03.0;  Surgeon: Makayla Koyanagi, MD;  Location: [place];  Service: Ophthalmology;  Laterality: Right;   CESAREAN SECTION     FOOT SURGERY     INCONTINENCE SURGERY     OVARIAN CYST SURGERY      Family History  Problem Relation Age of Onset   Cancer Mother    Heart attack Father    Cancer Father    Cancer Maternal Grandmother    Cancer Paternal Grandfather     Social History   Socioeconomic History   Marital status: Divorced    Spouse  name: Not on file   Number of children: 3   Years of education: HS   Highest education level: Not on file  Occupational History   Occupation: Editor, commissioning    Comment: [employer]  Tobacco Use   Smoking status: Former    Types: Cigarettes    Quit date: 07/09/2021    Years since quitting: 1.4   Smokeless tobacco: Never  Vaping Use   Vaping Use: Never used  Substance and Sexual Activity   Alcohol use: Yes    Alcohol/week: 14.0 standard drinks of alcohol    Types: 14 Glasses of wine per week    Comment: Consumes 2 glasses of wine daily about    Drug use: No    Sexual activity: Not Currently  Other Topics Concern   Not on file  Social History Narrative   Patient is right handed,resides alone      Patient drinks about 1-2 cups of caffeine daily.    Social Determinants of Health   Financial Resource Strain: Not on file  Food Insecurity: Not on file  Transportation Needs: Not on file  Physical Activity: Not on file  Stress: Not on file  Social Connections: Not on file  Intimate Partner Violence: Not on file    ROS Review of Systems  Constitutional:  Negative for chills and fever.  HENT:  Positive for tinnitus. Negative for ear discharge, ear pain and hearing loss.   Eyes:  Negative for visual disturbance.  Respiratory:  Negative for shortness of breath.   Cardiovascular:  Negative for chest pain.  Genitourinary:  Negative for dysuria and hematuria.    Objective:   Today's Vitals: There were no vitals taken for this visit.  Physical Exam Constitutional:      Appearance: Normal appearance.  HENT:     Head: Normocephalic and atraumatic.     Right Ear: Tympanic membrane, ear canal and external ear normal.     Left Ear: Tympanic membrane, ear canal and external ear normal.  Cardiovascular:     Rate and Rhythm: Normal rate and regular rhythm.  Pulmonary:     Effort: Pulmonary effort is normal.     Breath sounds: Normal breath sounds.  Musculoskeletal:     Right lower leg: No edema.     Left lower leg: No edema.  Skin:    General: Skin is warm and dry.  Neurological:     General: No focal deficit present.     Mental Status: She is alert. Mental status is at baseline.  Psychiatric:        Mood and Affect: Mood normal.        Behavior: Behavior normal.     Assessment & Plan:   1. Localized osteoporosis without current pathological fracture: Reviewed DEXA scan results from May 2021, osteoporosis in bilateral hips and lumbar spine.  Patient not currently on a bisphosphonate but had been on Fosamax about 2 years ago for 2  years total.  She is on vitamin D and calcium supplements.  She is due for repeat DEXA scan today, which will be ordered.  - DG Bone Density; Future  2. Encounter for screening mammogram for malignant neoplasm of breast: Mammogram ordered today.  - MM Digital Screening; Future  3. Need for influenza vaccination: Flu vaccine administered today.   - Flu Vaccine QUAD High Dose(Fluad)  4. Tinnitus, right: Ear exam normal today, offered to referred to   Follow-up: No follow-ups on file.   Teodora Medici,  DO

## 2023-01-06 ENCOUNTER — Ambulatory Visit (INDEPENDENT_AMBULATORY_CARE_PROVIDER_SITE_OTHER): Payer: 59 | Admitting: Internal Medicine

## 2023-01-06 ENCOUNTER — Encounter: Payer: Self-pay | Admitting: Internal Medicine

## 2023-01-06 VITALS — BP 116/72 | HR 80 | Temp 98.1°F | Resp 18 | Ht 59.0 in | Wt 115.9 lb

## 2023-01-06 DIAGNOSIS — G35 Multiple sclerosis: Secondary | ICD-10-CM

## 2023-01-06 DIAGNOSIS — M810 Age-related osteoporosis without current pathological fracture: Secondary | ICD-10-CM

## 2023-01-06 NOTE — Patient Instructions (Addendum)
It was great seeing you today!  Plan discussed at today's visit: -Call [phone] to schedule at [facility] for colonoscopy, referral placed last year -Referral to Endocrinology placed today as well   Follow up in: 6 months   Take care and let us know if you have any questions or concerns prior to your next visit.  Dr. Rosana Berger

## 2023-01-08 DIAGNOSIS — R922 Inconclusive mammogram: Secondary | ICD-10-CM | POA: Diagnosis not present

## 2023-01-08 DIAGNOSIS — R928 Other abnormal and inconclusive findings on diagnostic imaging of breast: Secondary | ICD-10-CM | POA: Diagnosis not present

## 2023-01-08 DIAGNOSIS — N6489 Other specified disorders of breast: Secondary | ICD-10-CM | POA: Diagnosis not present

## 2023-01-08 LAB — HM MAMMOGRAPHY

## 2023-01-09 ENCOUNTER — Encounter: Payer: Self-pay | Admitting: Internal Medicine

## 2023-03-04 ENCOUNTER — Other Ambulatory Visit: Payer: Self-pay | Admitting: Neurology

## 2023-03-04 DIAGNOSIS — G35 Multiple sclerosis: Secondary | ICD-10-CM | POA: Diagnosis not present

## 2023-03-04 DIAGNOSIS — R002 Palpitations: Secondary | ICD-10-CM | POA: Diagnosis not present

## 2023-03-04 DIAGNOSIS — Z87891 Personal history of nicotine dependence: Secondary | ICD-10-CM | POA: Diagnosis not present

## 2023-03-04 NOTE — Telephone Encounter (Signed)
Last seen on 05/01/2022 Follow up scheduled on 05/07/23 Last filled on 03/04/23 #180 tablets (90 day supply) Rx pending to be signed  Pharmacy attached note saying this refill is for future refill since pt just picked up 90 day supply today.

## 2023-03-21 DIAGNOSIS — S93409A Sprain of unspecified ligament of unspecified ankle, initial encounter: Secondary | ICD-10-CM | POA: Diagnosis not present

## 2023-03-21 DIAGNOSIS — X500XXA Overexertion from strenuous movement or load, initial encounter: Secondary | ICD-10-CM | POA: Diagnosis not present

## 2023-03-21 DIAGNOSIS — R600 Localized edema: Secondary | ICD-10-CM | POA: Diagnosis not present

## 2023-03-21 DIAGNOSIS — Z87891 Personal history of nicotine dependence: Secondary | ICD-10-CM | POA: Diagnosis not present

## 2023-03-21 DIAGNOSIS — S93401A Sprain of unspecified ligament of right ankle, initial encounter: Secondary | ICD-10-CM | POA: Diagnosis not present

## 2023-03-25 ENCOUNTER — Telehealth: Payer: Self-pay | Admitting: Internal Medicine

## 2023-03-25 NOTE — Telephone Encounter (Signed)
Pt will call to schedule CPE to discuss scheduling AAA for Fx hx

## 2023-03-25 NOTE — Telephone Encounter (Signed)
Copied from CRM [id number]. Topic: General - Other >> Mar 25, 2023  3:51 PM Dominique A wrote: Reason for CRM: Pt states that she still has not gotten any medication for her bone density. Pt states that she will be back in town on next Thursday 03/27/23 and will be there until Monday. Pt states that her sister had an aortic embolism rupture and she has been in [place] taking care of her. Pt would like a call back from her PCP to discuss.

## 2023-03-25 NOTE — Telephone Encounter (Signed)
Referral Request - Has patient seen PCP for this complaint? yes *If NO, is insurance requiring patient see PCP for this issue before PCP can refer them? Referral for which specialty: ? Preferred provider/office: ? Reason for referral: had aneurism on aorta so wants to get this checked

## 2023-03-26 NOTE — Telephone Encounter (Signed)
Pt is going to schedule a CPE but wants to go ahead and get scheduled for AAA screening.  She has strong family hx and sister just had suregury for and almost died.  Can you go ahead and place referral?

## 2023-04-07 DIAGNOSIS — E559 Vitamin D deficiency, unspecified: Secondary | ICD-10-CM | POA: Diagnosis not present

## 2023-04-07 DIAGNOSIS — I10 Essential (primary) hypertension: Secondary | ICD-10-CM | POA: Diagnosis not present

## 2023-04-07 DIAGNOSIS — M810 Age-related osteoporosis without current pathological fracture: Secondary | ICD-10-CM | POA: Diagnosis not present

## 2023-04-07 DIAGNOSIS — E538 Deficiency of other specified B group vitamins: Secondary | ICD-10-CM | POA: Diagnosis not present

## 2023-04-16 ENCOUNTER — Telehealth: Payer: Self-pay | Admitting: Internal Medicine

## 2023-04-16 ENCOUNTER — Other Ambulatory Visit: Payer: Self-pay | Admitting: Internal Medicine

## 2023-04-16 DIAGNOSIS — Z8249 Family history of ischemic heart disease and other diseases of the circulatory system: Secondary | ICD-10-CM

## 2023-04-16 NOTE — Telephone Encounter (Signed)
Pt.notified

## 2023-04-16 NOTE — Telephone Encounter (Signed)
Copied from CRM [id number]. Topic: General - Other >> Apr 15, 2023  4:01 PM Santiya F wrote: Reason for CRM: Pt is calling because she had an appointment with the endocrinologist and wants to make sure everything was sent over from that office to this one. >> Apr 15, 2023  4:32 PM Ja-Kwan M wrote: Pt insisted on speaking with someone in the office regarding some appts that were suppose to be scheduled. Pt stated she is coming from out of town and wants to make sure the appt that she requested have been scheduled. Pt requests call back asap at [phone]

## 2023-04-16 NOTE — Telephone Encounter (Signed)
Called and spoke to pt. Pt wanted to know why her ultrasound wasn't scheduled, so she could get it done while being here to get her CPE. Pt stated when she called in to get her CPE scheduled that she told the scheduling person that she needed this ultrasound and she was told it would get scheduled.   Pt is upset that this has not been done and that she is driving 4 hours to see Dr Caralee Ates due to being out of town to care for a relative. Please advise as to what we can do.

## 2023-04-17 NOTE — Progress Notes (Addendum)
Name: Makayla Wade   MRN: [id number]    DOB: 11-Oct-1951   Date:04/18/2023       Progress Note  Subjective  Chief Complaint  Chief Complaint  Patient presents with   Annual Exam    HPI  Patient presents for annual CPE.  Diet: well balanced - cereal and yogurt for breakfast. Sandwiches lunch, salad, protein and vegetables.    Depression: Phq 9 is  negative    04/18/2023    3:16 PM 01/06/2023    3:48 PM 09/18/2022    8:20 AM 03/05/2022    8:14 AM  Depression screen PHQ 2/9  Decreased Interest 0 0 0 0  Down, Depressed, Hopeless 0 0 0 0  PHQ - 2 Score 0 0 0 0  Altered sleeping 0 0 0 0  Tired, decreased energy 0 0 0 0  Change in appetite 0 0 0 0  Feeling bad or failure about yourself  0 0 0 0  Trouble concentrating 0 0 0 0  Moving slowly or fidgety/restless 0 0 0 0  Suicidal thoughts 0 0 0 0  PHQ-9 Score 0 0 0 0  Difficult doing work/chores Not difficult at all Not difficult at all Not difficult at all Not difficult at all   Hypertension: BP Readings from Last 3 Encounters:  04/18/23 (!) 150/78  01/06/23 116/72  09/18/22 122/70   Obesity: Wt Readings from Last 3 Encounters:  04/18/23 113 lb 8 oz (51.5 kg)  01/06/23 115 lb 14.4 oz (52.6 kg)  09/18/22 113 lb 14.4 oz (51.7 kg)   BMI Readings from Last 3 Encounters:  04/18/23 22.92 kg/m  01/06/23 23.41 kg/m  09/18/22 23.01 kg/m     Vaccines:  Tdap: Uncertain Shingrix: 2019 Pneumonia: 2023 Flu: 2023 COVID-19: 2022   Hep C Screening: Due  Breast cancer:  - Last Mammogram: 1/24  Osteoporosis Prevention : Discussed high calcium and vitamin D supplementation, weight bearing exercises Bone density :yes   Cervical cancer screening: n/a  Skin cancer: Discussed monitoring for atypical lesions  Colorectal cancer: Colonoscopy 10/2017, repeat in 10 years  Lung cancer:  Low Dose CT Chest recommended if Age 79-80 years, 20 pack-year currently smoking OR have quit w/in 15years. Patient does not qualify for screen    ECG: 01/02/22  Advanced Care Planning: A voluntary discussion about advance care planning including the explanation and discussion of advance directives.  Discussed health care proxy and Living will, and the patient was able to identify a health care proxy as daughter Jobie Quaker or son Darlinda, Pepi.  Patient does have a living will and power of attorney of health care   Lipids: Lab Results  Component Value Date   CHOL 202 (H) 04/29/2022   CHOL 211 (H) 04/04/2021   CHOL 197 05/04/2020   Lab Results  Component Value Date   HDL 69 04/29/2022   HDL 66 04/04/2021   HDL 63 05/04/2020   Lab Results  Component Value Date   LDLCALC 120 (H) 04/29/2022   LDLCALC 131 (H) 04/04/2021   LDLCALC 117 (H) 05/04/2020   Lab Results  Component Value Date   TRIG 75 04/29/2022   TRIG 78 04/04/2021   TRIG 95 05/04/2020   Lab Results  Component Value Date   CHOLHDL 2.9 04/29/2022   CHOLHDL 3.2 04/04/2021   CHOLHDL 3.1 05/04/2020   No results found for: "LDLDIRECT"  Glucose: Glucose  Date Value Ref Range Status  04/29/2022 81 70 - 99 mg/dL Final  40/98/1191  94 65 - 99 mg/dL Final  57/84/6962 83 65 - 99 mg/dL Final   Glucose, Bld  Date Value Ref Range Status  08/15/2022 100 (H) 70 - 99 mg/dL Final    Comment:    Glucose reference range applies only to samples taken after fasting for at least 8 hours.  10/16/2010 79 70 - 99 mg/dL Final    Patient Active Problem List   Diagnosis Date Noted   History of malignant melanoma 05/08/2022   Osteoporosis 03/05/2022   Migraine with aura 01/27/2012   Other generalized ischemic cerebrovascular disease 01/27/2012   Syncope and collapse 01/27/2012   Multiple sclerosis (HCC) 01/27/2012    Past Surgical History:  Procedure Laterality Date   BREAST LUMPECTOMY     CATARACT EXTRACTION W/PHACO Left 07/24/2022   Procedure: CATARACT EXTRACTION PHACO AND INTRAOCULAR LENS PLACEMENT ([facility]) LEFT  TORIC LENS;  Surgeon: Lockie Mola, MD;  Location: [facility];  Service: Ophthalmology;  Laterality: Left;  4.92 0:41.8   CATARACT EXTRACTION W/PHACO Right 08/07/2022   Procedure: CATARACT EXTRACTION PHACO AND INTRAOCULAR LENS PLACEMENT ([facility]) RIGHT  TORIC LENS 7.13 01:03.0;  Surgeon: Lockie Mola, MD;  Location: [facility];  Service: Ophthalmology;  Laterality: Right;   CESAREAN SECTION     FOOT SURGERY     INCONTINENCE SURGERY     OVARIAN CYST SURGERY      Family History  Problem Relation Age of Onset   Cancer Mother    Heart attack Father    Cancer Father    Cancer Maternal Grandmother    Cancer Paternal Grandfather     Social History   Socioeconomic History   Marital status: Divorced    Spouse name: Not on file   Number of children: 3   Years of education: HS   Highest education level: Not on file  Occupational History   Occupation: Information systems manager    Comment: [employer] [place]  Tobacco Use   Smoking status: Former    Types: Cigarettes    Quit date: 07/09/2021    Years since quitting: 1.7   Smokeless tobacco: Never  Vaping Use   Vaping Use: Never used  Substance and Sexual Activity   Alcohol use: Yes    Alcohol/week: 14.0 standard drinks of alcohol    Types: 14 Glasses of wine per week    Comment: Consumes 2 glasses of wine daily about    Drug use: No   Sexual activity: Not Currently  Other Topics Concern   Not on file  Social History Narrative   Patient is right handed,resides alone      Patient drinks about 1-2 cups of caffeine daily.    Social Determinants of Health   Financial Resource Strain: Not on file  Food Insecurity: Not on file  Transportation Needs: Not on file  Physical Activity: Not on file  Stress: Not on file  Social Connections: Not on file  Intimate Partner Violence: Not on file     Current Outpatient Medications:    Ascorbic Acid (VITAMIN C) 100 MG tablet, Take by mouth., Disp: , Rfl:    baclofen (LIORESAL) 10  MG tablet, TAKE 1 TABLET BY MOUTH 2 TIMES DAILY, Disp: 180 each, Rfl: 3   Calcium Carbonate-Vitamin D 600-400 MG-UNIT tablet, Take 1 tablet by mouth daily., Disp: , Rfl:    cholecalciferol (VITAMIN D) 1000 UNITS tablet, Take 1,000 Units by mouth daily., Disp: , Rfl:    clonazePAM (KLONOPIN) 0.5 MG tablet, TAKE 1 TABLET  BY MOUTH TWICE DAILY, Disp: 180 tablet, Rfl: 0   Cyanocobalamin (VITAMIN B 12 PO), Take by mouth daily., Disp: , Rfl:    Multiple Vitamins-Minerals (CENTRUM SILVER ADULT 50+ PO), Take 1 tablet by mouth daily., Disp: , Rfl:    rizatriptan (MAXALT-MLT) 5 MG disintegrating tablet, Take 1 tablet (5 mg total) by mouth as needed for migraine. May repeat in 2 hours if needed, Disp: 10 tablet, Rfl: 11  Allergies  Allergen Reactions   Sulfa Antibiotics Other (See Comments)    Funny sensations, rapid heartbeats   Copper-Containing Compounds      Review of Systems  Neurological:  Positive for tingling and sensory change.     Objective  Vitals:   04/18/23 1513  BP: (!) 150/78  Pulse: (!) 108  Resp: 16  Temp: 97.9 F (36.6 C)  SpO2: 97%  Weight: 113 lb 8 oz (51.5 kg)  Height: 4\' 11"  (1.499 m)    Body mass index is 22.92 kg/m.  Physical Exam Constitutional:      Appearance: Normal appearance.  HENT:     Head: Normocephalic and atraumatic.  Eyes:     Conjunctiva/sclera: Conjunctivae normal.  Neck:     Vascular: No carotid bruit.  Cardiovascular:     Rate and Rhythm: Normal rate and regular rhythm.     Pulses:          Dorsalis pedis pulses are 2+ on the right side and 2+ on the left side.  Pulmonary:     Effort: Pulmonary effort is normal.     Breath sounds: Normal breath sounds.  Musculoskeletal:     Right lower leg: No edema.     Left lower leg: No edema.  Skin:    General: Skin is warm and dry.  Neurological:     General: No focal deficit present.     Mental Status: She is alert. Mental status is at baseline.  Psychiatric:        Mood and Affect: Mood  normal.        Behavior: Behavior normal.     No results found for this or any previous visit (from the past 2160 hour(s)).   Fall Risk:    04/18/2023    3:14 PM 01/06/2023    3:46 PM 09/18/2022    8:20 AM 03/05/2022    8:14 AM  Fall Risk   Falls in the past year? 1 0 0 0  Number falls in past yr: 0 0 0 0  Injury with Fall? 0 0 0 0    Functional Status Survey: Is the patient deaf or have difficulty hearing?: Yes Does the patient have difficulty seeing, even when wearing glasses/contacts?: No Does the patient have difficulty concentrating, remembering, or making decisions?: No Does the patient have difficulty walking or climbing stairs?: No Does the patient have difficulty dressing or bathing?: No Does the patient have difficulty doing errands alone such as visiting a doctor's office or shopping?: No   Assessment & Plan  1. Annual physical exam/Multiple falls/Numbness and tingling of both lower extremities/Lipid screening/Need for hepatitis C screening test: Labs ordered. Patient has been having multiple falls recently, does have MS. Is seeing her Neurologist soon, recommend an EMG but will make sure there is no metabolic cause for symptoms.   - CBC w/Diff/Platelet - COMPLETE METABOLIC PANEL WITH GFR - Lipid Profile - Hepatitis C Antibody - TSH - HgB A1c   -USPSTF grade A and B recommendations reviewed with patient; age-appropriate recommendations, preventive care,  screening tests, etc discussed and encouraged; healthy living encouraged; see AVS for patient education given to patient -Discussed importance of 150 minutes of physical activity weekly, eat two servings of fish weekly, eat one serving of tree nuts ( cashews, pistachios, pecans, almonds.Marland Kitchen) every other day, eat 6 servings of fruit/vegetables daily and drink plenty of water and avoid sweet beverages.   -Reviewed Health Maintenance: Yes.

## 2023-04-18 ENCOUNTER — Encounter: Payer: Self-pay | Admitting: Internal Medicine

## 2023-04-18 ENCOUNTER — Telehealth: Payer: Self-pay | Admitting: Internal Medicine

## 2023-04-18 ENCOUNTER — Ambulatory Visit (INDEPENDENT_AMBULATORY_CARE_PROVIDER_SITE_OTHER): Payer: Medicare PPO | Admitting: Internal Medicine

## 2023-04-18 VITALS — BP 150/78 | HR 108 | Temp 97.9°F | Resp 16 | Ht 59.0 in | Wt 113.5 lb

## 2023-04-18 DIAGNOSIS — R200 Anesthesia of skin: Secondary | ICD-10-CM

## 2023-04-18 DIAGNOSIS — Z1159 Encounter for screening for other viral diseases: Secondary | ICD-10-CM | POA: Diagnosis not present

## 2023-04-18 DIAGNOSIS — R296 Repeated falls: Secondary | ICD-10-CM

## 2023-04-18 DIAGNOSIS — R202 Paresthesia of skin: Secondary | ICD-10-CM

## 2023-04-18 DIAGNOSIS — E785 Hyperlipidemia, unspecified: Secondary | ICD-10-CM | POA: Diagnosis not present

## 2023-04-18 DIAGNOSIS — Z13220 Encounter for screening for lipoid disorders: Secondary | ICD-10-CM

## 2023-04-18 DIAGNOSIS — Z0000 Encounter for general adult medical examination without abnormal findings: Secondary | ICD-10-CM

## 2023-04-18 NOTE — Telephone Encounter (Signed)
Pt was inquiring if she need another appointment or if you want her to wait until she get all her test results so she can also start on her bone medication. Give her a call once it is all figured out

## 2023-04-18 NOTE — Addendum Note (Signed)
Addended by: Margarita Mail on: 04/18/2023 04:11 PM   Modules accepted: Level of Service

## 2023-04-19 LAB — LIPID PANEL
Cholesterol: 216 mg/dL — ABNORMAL HIGH (ref <200)
HDL: 70 mg/dL (ref >=50)
LDL Cholesterol (Calc): 121 mg/dL (calc) — ABNORMAL HIGH
Non-HDL Cholesterol (Calc): 146 mg/dL (calc) — ABNORMAL HIGH (ref <130)
Total CHOL/HDL Ratio: 3.1 (calc) (ref <5.0)
Triglycerides: 130 mg/dL (ref <150)

## 2023-04-19 LAB — COMPLETE METABOLIC PANEL WITH GFR
AG Ratio: 1.8 (calc) (ref 1.0–2.5)
ALT: 13 U/L (ref 6–29)
AST: 21 U/L (ref 10–35)
Albumin: 4.1 g/dL (ref 3.6–5.1)
Alkaline phosphatase (APISO): 70 U/L (ref 37–153)
BUN: 22 mg/dL (ref 7–25)
CO2: 27 mmol/L (ref 20–32)
Calcium: 9.6 mg/dL (ref 8.6–10.4)
Chloride: 109 mmol/L (ref 98–110)
Creat: 0.74 mg/dL (ref 0.60–1.00)
Globulin: 2.3 g/dL (calc) (ref 1.9–3.7)
Glucose, Bld: 83 mg/dL (ref 65–99)
Potassium: 4.3 mmol/L (ref 3.5–5.3)
Sodium: 143 mmol/L (ref 135–146)
Total Bilirubin: 0.4 mg/dL (ref 0.2–1.2)
Total Protein: 6.4 g/dL (ref 6.1–8.1)
eGFR: 85 mL/min/{1.73_m2} (ref >=60)

## 2023-04-19 LAB — CBC WITH DIFFERENTIAL/PLATELET
Absolute Monocytes: 427 cells/uL (ref 200–950)
Basophils Absolute: 70 cells/uL (ref 0–200)
Basophils Relative: 1.3 %
Eosinophils Absolute: 157 cells/uL (ref 15–500)
Eosinophils Relative: 2.9 %
HCT: 40.1 % (ref 35.0–45.0)
Hemoglobin: 13.8 g/dL (ref 11.7–15.5)
Lymphs Abs: 1512 cells/uL (ref 850–3900)
MCH: 32.7 pg (ref 27.0–33.0)
MCHC: 34.4 g/dL (ref 32.0–36.0)
MCV: 95 fL (ref 80.0–100.0)
MPV: 12.1 fL (ref 7.5–12.5)
Monocytes Relative: 7.9 %
Neutro Abs: 3235 cells/uL (ref 1500–7800)
Neutrophils Relative %: 59.9 %
Platelets: 234 10*3/uL (ref 140–400)
RBC: 4.22 10*6/uL (ref 3.80–5.10)
RDW: 12.8 % (ref 11.0–15.0)
Total Lymphocyte: 28 %
WBC: 5.4 10*3/uL (ref 3.8–10.8)

## 2023-04-19 LAB — HEMOGLOBIN A1C
Hgb A1c MFr Bld: 5.2 % of total Hgb (ref <5.7)
Mean Plasma Glucose: 103 mg/dL
eAG (mmol/L): 5.7 mmol/L

## 2023-04-19 LAB — HEPATITIS C ANTIBODY: Hepatitis C Ab: NONREACTIVE

## 2023-04-19 LAB — TSH: TSH: 1.36 mIU/L (ref 0.40–4.50)

## 2023-04-21 ENCOUNTER — Telehealth: Payer: Self-pay | Admitting: Internal Medicine

## 2023-04-21 ENCOUNTER — Other Ambulatory Visit: Payer: Self-pay | Admitting: Internal Medicine

## 2023-04-21 ENCOUNTER — Ambulatory Visit
Admission: RE | Admit: 2023-04-21 | Discharge: 2023-04-21 | Disposition: A | Discharge status: Home / Self Care | Payer: Medicare PPO | Source: Ambulatory Visit | Attending: Internal Medicine | Admitting: Internal Medicine

## 2023-04-21 DIAGNOSIS — Z8249 Family history of ischemic heart disease and other diseases of the circulatory system: Secondary | ICD-10-CM | POA: Insufficient documentation

## 2023-04-21 DIAGNOSIS — E782 Mixed hyperlipidemia: Secondary | ICD-10-CM

## 2023-04-21 DIAGNOSIS — Z136 Encounter for screening for cardiovascular disorders: Secondary | ICD-10-CM | POA: Diagnosis not present

## 2023-04-21 MED ORDER — ROSUVASTATIN CALCIUM 10 MG PO TABS: 10.0000 mg | ORAL_TABLET | Freq: Every day | ORAL | 1 refills | Status: DC

## 2023-04-21 NOTE — Telephone Encounter (Signed)
Copied from CRM [id number]. Topic: General - Inquiry >> Apr 21, 2023 11:33 AM De Blanch wrote: Reason for CRM: Pt is requesting a callback with the total cholesterol numberfrom recent labs.  I offered to grab a nurse while she was on the line, and the patient declined to hold.  Please advise.

## 2023-05-06 ENCOUNTER — Encounter: Payer: Self-pay | Admitting: Neurology

## 2023-05-06 ENCOUNTER — Ambulatory Visit: Payer: Medicare PPO | Admitting: Neurology

## 2023-05-06 ENCOUNTER — Telehealth: Payer: Self-pay | Admitting: Neurology

## 2023-05-06 VITALS — BP 124/73 | HR 73 | Ht 59.0 in | Wt 112.5 lb

## 2023-05-06 DIAGNOSIS — R42 Dizziness and giddiness: Secondary | ICD-10-CM

## 2023-05-06 DIAGNOSIS — G43109 Migraine with aura, not intractable, without status migrainosus: Secondary | ICD-10-CM | POA: Diagnosis not present

## 2023-05-06 DIAGNOSIS — M810 Age-related osteoporosis without current pathological fracture: Secondary | ICD-10-CM | POA: Diagnosis not present

## 2023-05-06 DIAGNOSIS — G35 Multiple sclerosis: Secondary | ICD-10-CM

## 2023-05-06 NOTE — Progress Notes (Signed)
[facility]  PATIENT: Makayla Wade DOB: 12/12/1950  REFERRING DOCTOR OR PCP: Margarita Mail, DO SOURCE: Patient, notes from Dr. Anne Hahn, imaging and lab reports, MRI images personally reviewed.  _________________________________   HISTORICAL  CHIEF COMPLAINT:  Chief Complaint  Patient presents with   Follow-up    Pt in room 11. Here for MS follow up. Pt reports doing okay, c/o feet tingling constant,has feeling in feet. Pt said Maxalt doesn't help with migraines.  Pt has osteoporosis going to possible start on Prolia injection. Pt on Amoxicillin (not sure of the dose) for root canal.    HISTORY OF PRESENT ILLNESS:  Makayla Wade is a 72 y.o.  woman with multiple sclerosis.     Update 05/06/2023: She has had MS for more than 40 years and is not on a disease modifying therapy.  She has been neurologically stable.  She has been in [facility] helping her sister who had a ruptured AAA  She has left hand numbness.    She does a lot of work on the computer throughout the day.   She will be moving but plans to continue medical care with [place].    She has had more stress with her sisters health issues (AAA and memory decline).    Gait and balance are stable - she has mild reduced balance but feels she keeps up with friends..  She fell hitting her head last month.   Sometimes the right side feels a little spastic.  No change in vision or bowel or bladder function had been noted..  Mood is fine.   Cognition is doing well.   She works with math daily (accounting)    She has uncomfortable dysesthesias in her feet, worse when sitting after she walks.    She has a history of migraine headache primarily visual aura x 30-40 minutes with headache but the patient feels wiped out and spacey afterwards.  The episodes occur 3-4 times a month now.    She was tried on propranolol without a benefit.   She was placed on rizatriptan but it did not help.     However, migraines generally are better  since her cataract surgery.     She has vertigo sometimes laying down and also notes tinnitus.    She stopped working as she needed to be out to help her sister.    She was found to have elevated cholesterol and started Crestor.   She has osteoporosis and will be starting Prolia.     MS History At age 72 (1969) she had tingling in her hands that lasted several weeks.   She had a few other episodes of tingling over the next few years.   In 1976, she had serial CT scans (no MRI's) due to a focus that was seen.    MS was entertained (vs tumor).  In 1984, she moved to [place] and was unable to get any imaging.  She had a LP at [facility] (Dr. Loreta Ave) and it was c/w MS.   She had an MRI in 1984 and it was consistent with MS.    She was never on a DMT.   Her last exacerbation was probably in the 1980's/1990's.   She did have spells of spasticity, that were initially thought to be seizure.  She no longer gets these type of spells.  Imaging personally reviewed:  MRI of the brain 05/19/2014 and 02/08/2018 showed an identical pattern of T2/FLAIR hyperintense foci in the periventricular, juxtacortical and  deep white matter consistent with chronic demyelinating plaque associated with multiple sclerosis.  None of the foci appear to be acute they do not enhance.  No changes over the 4 years.   REVIEW OF SYSTEMS: Constitutional: No fevers, chills, sweats, or change in appetite Eyes: No visual changes, double vision, eye pain Ear, nose and throat: No hearing loss, ear pain, nasal congestion, sore throat Cardiovascular: No chest pain, palpitations Respiratory:  No shortness of breath at rest or with exertion.   No wheezes GastrointestinaI: No nausea, vomiting, diarrhea, abdominal pain, fecal incontinence Genitourinary:  No dysuria, urinary retention or frequency.  No nocturia. Musculoskeletal:  No neck pain, back pain Integumentary: No rash, pruritus, skin lesions Neurological: as above Psychiatric: No depression at  this time.  No anxiety Endocrine: No palpitations, diaphoresis, change in appetite, change in weigh or increased thirst Hematologic/Lymphatic:  No anemia, purpura, petechiae. Allergic/Immunologic: No itchy/runny eyes, nasal congestion, recent allergic reactions, rashes  ALLERGIES: Allergies  Allergen Reactions   Sulfa Antibiotics Other (See Comments)    Funny sensations, rapid heartbeats   Copper-Containing Compounds     HOME MEDICATIONS:  Current Outpatient Medications:    Ascorbic Acid (VITAMIN C) 100 MG tablet, Take by mouth., Disp: , Rfl:    baclofen (LIORESAL) 10 MG tablet, TAKE 1 TABLET BY MOUTH 2 TIMES DAILY, Disp: 180 each, Rfl: 3   Calcium Carbonate-Vitamin D 600-400 MG-UNIT tablet, Take 1 tablet by mouth daily., Disp: , Rfl:    cholecalciferol (VITAMIN D) 1000 UNITS tablet, Take 1,000 Units by mouth daily., Disp: , Rfl:    clonazePAM (KLONOPIN) 0.5 MG tablet, TAKE 1 TABLET BY MOUTH TWICE DAILY, Disp: 180 tablet, Rfl: 0   Cyanocobalamin (VITAMIN B 12 PO), Take by mouth daily., Disp: , Rfl:    Multiple Vitamins-Minerals (CENTRUM SILVER ADULT 50+ PO), Take 1 tablet by mouth daily., Disp: , Rfl:    rosuvastatin (CRESTOR) 10 MG tablet, Take 1 tablet (10 mg total) by mouth daily., Disp: 90 tablet, Rfl: 1   rizatriptan (MAXALT-MLT) 5 MG disintegrating tablet, Take 1 tablet (5 mg total) by mouth as needed for migraine. May repeat in 2 hours if needed (Patient not taking: Reported on 05/06/2023), Disp: 10 tablet, Rfl: 11  PAST MEDICAL HISTORY: Past Medical History:  Diagnosis Date   Anxiety    Depression    Hx.   Elevated blood pressure reading    Hemorrhoids    Hx.   Hypertension    Migraine headache    Multiple sclerosis (HCC)    Optic neuritis, right    Osteoporosis    Seizures (HCC)    Hx.    Syncope    Tonic seizures (HCC)    prior to 1984, when started on meds for MS   Ulcerative colitis (HCC)     PAST SURGICAL HISTORY: Past Surgical History:  Procedure  Laterality Date   BREAST LUMPECTOMY     CATARACT EXTRACTION W/PHACO Left 07/24/2022   Procedure: CATARACT EXTRACTION PHACO AND INTRAOCULAR LENS PLACEMENT (IOC) LEFT  TORIC LENS;  Surgeon: Lockie Mola, MD;  Location: [facility];  Service: Ophthalmology;  Laterality: Left;  4.92 0:41.8   CATARACT EXTRACTION W/PHACO Right 08/07/2022   Procedure: CATARACT EXTRACTION PHACO AND INTRAOCULAR LENS PLACEMENT (IOC) RIGHT  TORIC LENS 7.13 01:03.0;  Surgeon: Lockie Mola, MD;  Location: [facility];  Service: Ophthalmology;  Laterality: Right;   CESAREAN SECTION     FOOT SURGERY     INCONTINENCE SURGERY     OVARIAN CYST  SURGERY      FAMILY HISTORY: Family History  Problem Relation Age of Onset   Cancer Mother    Heart attack Father    Cancer Father    Cancer Maternal Grandmother    Cancer Paternal Grandfather     SOCIAL HISTORY:  Social History   Socioeconomic History   Marital status: Divorced    Spouse name: Not on file   Number of children: 3   Years of education: HS   Highest education level: 12th grade  Occupational History   Occupation: Information systems manager    Comment: [employer]  Tobacco Use   Smoking status: Former    Types: Cigarettes    Quit date: 07/09/2021    Years since quitting: 1.8   Smokeless tobacco: Never  Vaping Use   Vaping Use: Never used  Substance and Sexual Activity   Alcohol use: Yes    Alcohol/week: 14.0 standard drinks of alcohol    Types: 14 Glasses of wine per week    Comment: Consumes 2 glasses of wine daily about    Drug use: No   Sexual activity: Not Currently  Other Topics Concern   Not on file  Social History Narrative   Patient is right handed,resides alone      Patient drinks about 1-2 cups of caffeine daily.    Social Determinants of Health   Financial Resource Strain: Low Risk  (05/05/2023)   Overall Financial Resource Strain (CARDIA)    Difficulty of Paying Living Expenses: Not  very hard  Food Insecurity: No Food Insecurity (05/05/2023)   Hunger Vital Sign    Worried About Running Out of Food in the Last Year: Never true    Ran Out of Food in the Last Year: Never true  Transportation Needs: Unknown (05/05/2023)   PRAPARE - Administrator, Civil Service (Medical): Not on file    Lack of Transportation (Non-Medical): No  Physical Activity: Insufficiently Active (05/05/2023)   Exercise Vital Sign    Days of Exercise per Week: 3 days    Minutes of Exercise per Session: 20 min  Stress: No Stress Concern Present (05/05/2023)   [employer] of Occupational Health - Occupational Stress Questionnaire    Feeling of Stress : Only a little  Social Connections: Unknown (05/05/2023)   Social Connection and Isolation Panel [NHANES]    Frequency of Communication with Friends and Family: More than three times a week    Frequency of Social Gatherings with Friends and Family: More than three times a week    Attends Religious Services: Patient declined    Database administrator or Organizations: Yes    Attends Engineer, structural: More than 4 times per year    Marital Status: Divorced  Intimate Partner Violence: Not on file     PHYSICAL EXAM  Vitals:   05/06/23 0858  BP: 124/73  Pulse: 73  Weight: 112 lb 8 oz (51 kg)  Height: 4\' 11"  (1.499 m)    Body mass index is 22.72 kg/m.   General: The patient is well-developed and well-nourished and in no acute distress  HEENT: Head is normocephalic and atraumatic.  Sclera are anicteric.  Skin: Extremities are without rash or edema.  Musculoskeletal:  Back is nontender  Neurologic Exam  Mental status: The patient is alert and oriented x 3 at the time of the examination. The patient has apparent normal recent and remote memory, with an apparently normal attention span and concentration  ability.   Speech is normal.  Cranial nerves: Extraocular movements are full.  Vision was symmetric.  Facial  strength and sensation was normal.  No obvious hearing deficits are noted.  The Weber did not lateralize.  Motor:  Muscle bulk is normal.   Tone is normal. Strength is  5 / 5 in all 4 extremities.   Sensory: Sensory testing shows reduced pp sensation o right foot and mild reduced vibration in toes only, left worse than right.  Coordination: Cerebellar testing reveals good finger-nose-finger and heel-to-shin bilaterally.  Gait and station: Station is normal.   Gait is normal. Tandem gait is mildly wide. Romberg is negative.   Reflexes: Deep tendon reflexes are symmetric and normal bilaterally.   Plantar responses are flexor.    DIAGNOSTIC DATA (LABS, IMAGING, TESTING) - I reviewed patient records, labs, notes, testing and imaging myself where available.  Lab Results  Component Value Date   WBC 5.4 04/18/2023   HGB 13.8 04/18/2023   HCT 40.1 04/18/2023   MCV 95.0 04/18/2023   PLT 234 04/18/2023      Component Value Date/Time   NA 143 04/18/2023 1604   NA 140 04/29/2022 0841   K 4.3 04/18/2023 1604   CL 109 04/18/2023 1604   CO2 27 04/18/2023 1604   GLUCOSE 83 04/18/2023 1604   BUN 22 04/18/2023 1604   BUN 16 04/29/2022 0841   CREATININE 0.74 04/18/2023 1604   CALCIUM 9.6 04/18/2023 1604   PROT 6.4 04/18/2023 1604   PROT 7.1 04/29/2022 0841   ALBUMIN 4.5 08/15/2022 2046   ALBUMIN 4.4 04/29/2022 0841   AST 21 04/18/2023 1604   ALT 13 04/18/2023 1604   ALKPHOS 71 08/15/2022 2046   BILITOT 0.4 04/18/2023 1604   BILITOT 0.5 04/29/2022 0841   GFRNONAA >60 08/15/2022 2046   GFRAA 105 05/04/2020 0910   Lab Results  Component Value Date   CHOL 216 (H) 04/18/2023   HDL 70 04/18/2023   LDLCALC 121 (H) 04/18/2023   TRIG 130 04/18/2023   CHOLHDL 3.1 04/18/2023    Lab Results  Component Value Date   TSH 1.36 04/18/2023      ASSESSMENT AND PLAN  Multiple sclerosis (HCC) - Plan: MR BRAIN W WO CONTRAST  Vertigo - Plan: MR BRAIN W WO CONTRAST  Migraine with aura and  without status migrainosus, not intractable  Osteoporosis, unspecified osteoporosis type, unspecified pathological fracture presence   Her MS was diagnosed in the 1970's and appears to be very stable..   No exacerbation x 30 years and last 2 MRI's were stable (2015 and 2019).   She will remain off a DMT.  I will check an MRI of the brain and compared to her previous 1 to determine if there has been significant progression.  If this is occurring we would need to consider adding a disease modifying therapy.   Maxalt had not helped.    Trial of Ubrelvy for migraines with visual aura (sample x 2 Lot [id number]  Exp09/2025).   If migraines occur more than 2 x a week will recommend a prophylactic agent.   3.   Stay active and exercise as tolerated. 4.   Rtc one year   40-minute office visit with the majority of the time spent face-to-face for history and physical, discussion/counseling and decision-making.  Additional time with record review and documentation.   Weldon Nouri A. Epimenio Foot, MD, [facility] 05/06/2023, 1:58 PM Certified in Neurology, Clinical Neurophysiology, Sleep Medicine and Neuroimaging  [facility] [phone]  [address], [address] [place], [place] [address] [phone]

## 2023-05-06 NOTE — Telephone Encounter (Signed)
Ethlyn Gallery: [id number] exp. 05/06/23-06/05/23 sent to GI [phone]

## 2023-05-07 ENCOUNTER — Ambulatory Visit: Payer: 59 | Admitting: Neurology

## 2023-05-08 DIAGNOSIS — H40003 Preglaucoma, unspecified, bilateral: Secondary | ICD-10-CM | POA: Diagnosis not present

## 2023-05-08 DIAGNOSIS — Z961 Presence of intraocular lens: Secondary | ICD-10-CM | POA: Diagnosis not present

## 2023-05-08 DIAGNOSIS — G35 Multiple sclerosis: Secondary | ICD-10-CM | POA: Diagnosis not present

## 2023-05-08 NOTE — Progress Notes (Signed)
Established Patient Office Visit  Subjective:  Patient ID: Makayla Wade, female    DOB: 1951-05-29  Age: 72 y.o. MRN: [id number]  CC:  Chief Complaint  Patient presents with   Hyperlipidemia    Discuss chol. Meds, states has made her MS worse    HPI Makayla Wade presents for follow up.   HLD: -Medications: Started on Crestor 10 mg a few weeks ago -causing some fatigue and vague muscle pain but patient states she discussed with pharmacist and is willing to cut pill in half until she can tolerate it -Last lipid panel: Lipid Panel     Component Value Date/Time   CHOL 216 (H) 04/18/2023 1604   CHOL 202 (H) 04/29/2022 0841   TRIG 130 04/18/2023 1604   HDL 70 04/18/2023 1604   HDL 69 04/29/2022 0841   CHOLHDL 3.1 04/18/2023 1604   LDLCALC 121 (H) 04/18/2023 1604   LABVLDL 13 04/29/2022 0841   The 10-year ASCVD risk score (Arnett DK, et al., 2019) is: 12.1%   Values used to calculate the score:     Age: 1 years     Sex: Female     Is Non-Hispanic African American: No     Diabetic: No     Tobacco smoker: No     Systolic Blood Pressure: 116 mmHg     Is BP treated: No     HDL Cholesterol: 70 mg/dL     Total Cholesterol: 216 mg/dL   MS: -Following with Neurology, last seen 05/06/23 - no exacerbation of symptoms in multiple years, last 2 MRI's stable -Is on Baclofen and Klonopin as needed for symptoms  -Diagnosed in 1984 -Last MRI brain in 02/2018, Neurology scheduled a new one  -Has had seizures in the past associated with MS, last seizure at least 30 years ago, not on any medication  -No changes reported   UC: -Diagnosed on a past colonoscopy, no abnormalities other than polyp in 2018 -No symptoms of abdominal cramping, diarrhea or bloody stools -Does have some constipation but no other active issues -Referral placed for colonoscopy repeat in March but has not been able to schedule  Ocular Migraines: -Started on Ubrelvy per Neurology   Elevated Blood Pressure  Readings: -160's at work, had been prescribed Propanolol but made her feel very tired and stopped taking it  -Occasionally feels heart fluttering, no chest pain, no shortness of breath  -Checks blood pressure at home: 1120-169/70-80  Health Maintenance: -Blood work UTD -Mammogram 1/24 -DEXA -05/03/2020 T score right femoral neck -3.0, left femoral neck -2.90, lumbar spine T score -2.30 with statistically significant decrease in spine, bilateral hips stable. -DEXA 12/27/22: left femur -3.2, right femur -2.2, lumbar spine -2.4  -Currently on calcium and Vitamin D. Had been Fosamax 70 mg for the last 5 years but not on it currently.  Has been on this medication for 2 years total, does not recall why it was discontinued but was having problems with her teeth while on it. Has not been on any other medications for osteoporosis. .    Past Medical History:  Diagnosis Date   Anxiety    Depression    Hx.   Elevated blood pressure reading    Hemorrhoids    Hx.   Hypertension    Migraine headache    Multiple sclerosis (HCC)    Optic neuritis, right    Osteoporosis    Seizures (HCC)    Hx.    Syncope    Tonic seizures (  HCC)    prior to 1984, when started on meds for MS   Ulcerative colitis [facility])     Past Surgical History:  Procedure Laterality Date   BREAST LUMPECTOMY     CATARACT EXTRACTION W/PHACO Left 07/24/2022   Procedure: CATARACT EXTRACTION PHACO AND INTRAOCULAR LENS PLACEMENT (IOC) LEFT  TORIC LENS;  Surgeon: Lockie Mola, MD;  Location: [facility];  Service: Ophthalmology;  Laterality: Left;  4.92 0:41.8   CATARACT EXTRACTION W/PHACO Right 08/07/2022   Procedure: CATARACT EXTRACTION PHACO AND INTRAOCULAR LENS PLACEMENT (IOC) RIGHT  TORIC LENS 7.13 01:03.0;  Surgeon: Lockie Mola, MD;  Location: [facility];  Service: Ophthalmology;  Laterality: Right;   CESAREAN SECTION     FOOT SURGERY     INCONTINENCE SURGERY     OVARIAN CYST SURGERY       Family History  Problem Relation Age of Onset   Cancer Mother    Heart attack Father    Cancer Father    Cancer Maternal Grandmother    Cancer Paternal Grandfather     Social History   Socioeconomic History   Marital status: Divorced    Spouse name: Not on file   Number of children: 3   Years of education: HS   Highest education level: 12th grade  Occupational History   Occupation: Information systems manager    Comment: [employer] [place]  Tobacco Use   Smoking status: Former    Types: Cigarettes    Quit date: 07/09/2021    Years since quitting: 1.8   Smokeless tobacco: Never  Vaping Use   Vaping Use: Never used  Substance and Sexual Activity   Alcohol use: Yes    Alcohol/week: 14.0 standard drinks of alcohol    Types: 14 Glasses of wine per week    Comment: Consumes 2 glasses of wine daily about    Drug use: No   Sexual activity: Not Currently  Other Topics Concern   Not on file  Social History Narrative   Patient is right handed,resides alone      Patient drinks about 1-2 cups of caffeine daily.    Social Determinants of Health   Financial Resource Strain: Low Risk  (05/05/2023)   Overall Financial Resource Strain (CARDIA)    Difficulty of Paying Living Expenses: Not very hard  Food Insecurity: No Food Insecurity (05/05/2023)   Hunger Vital Sign    Worried About Running Out of Food in the Last Year: Never true    Ran Out of Food in the Last Year: Never true  Transportation Needs: Unknown (05/05/2023)   PRAPARE - Administrator, Civil Service (Medical): Not on file    Lack of Transportation (Non-Medical): No  Physical Activity: Insufficiently Active (05/05/2023)   Exercise Vital Sign    Days of Exercise per Week: 3 days    Minutes of Exercise per Session: 20 min  Stress: No Stress Concern Present (05/05/2023)   [employer] of Occupational Health - Occupational Stress Questionnaire    Feeling of Stress : Only a little  Social  Connections: Unknown (05/05/2023)   Social Connection and Isolation Panel [NHANES]    Frequency of Communication with Friends and Family: More than three times a week    Frequency of Social Gatherings with Friends and Family: More than three times a week    Attends Religious Services: Patient declined    Active Member of Clubs or Organizations: Yes    Attends Banker Meetings: More than  4 times per year    Marital Status: Divorced  Catering manager Violence: Not on file    ROS Review of Systems  Constitutional:  Negative for chills and fever.  HENT:  Positive for tinnitus. Negative for ear discharge, ear pain and hearing loss.   Eyes:  Negative for visual disturbance.  Respiratory:  Negative for shortness of breath.   Cardiovascular:  Negative for chest pain.  Genitourinary:  Negative for dysuria and hematuria.    Objective:   Today's Vitals: BP 116/72   Pulse 95   Temp 97.6 F (36.4 C)   Resp 16   Ht 4\' 11"  (1.499 m)   Wt 111 lb 6.4 oz (50.5 kg)   SpO2 97%   BMI 22.50 kg/m   Physical Exam Constitutional:      Appearance: Normal appearance.  HENT:     Head: Normocephalic and atraumatic.  Eyes:     Conjunctiva/sclera: Conjunctivae normal.  Cardiovascular:     Rate and Rhythm: Normal rate and regular rhythm.  Pulmonary:     Effort: Pulmonary effort is normal.     Breath sounds: Normal breath sounds.  Musculoskeletal:     Right lower leg: No edema.     Left lower leg: No edema.  Skin:    General: Skin is warm and dry.  Neurological:     General: No focal deficit present.     Mental Status: She is alert. Mental status is at baseline.  Psychiatric:        Mood and Affect: Mood normal.        Behavior: Behavior normal.     Assessment & Plan:   1. Mixed hyperlipidemia: Patient having side effects but wanting to cut pill in half until she can tolerate it. Discussed we can try something like Nexlizet as well but will continue Crestor for now.     Follow-up: Return in 6 months (on 11/08/2023) for can cancel july appointment .   Margarita Mail, DO

## 2023-05-09 ENCOUNTER — Ambulatory Visit: Payer: Medicare PPO | Admitting: Internal Medicine

## 2023-05-09 ENCOUNTER — Encounter: Payer: Self-pay | Admitting: Internal Medicine

## 2023-05-09 VITALS — BP 116/72 | HR 95 | Temp 97.6°F | Resp 16 | Ht 59.0 in | Wt 111.4 lb

## 2023-05-09 DIAGNOSIS — E782 Mixed hyperlipidemia: Secondary | ICD-10-CM

## 2023-05-17 ENCOUNTER — Ambulatory Visit
Admission: RE | Admit: 2023-05-17 | Discharge: 2023-05-17 | Disposition: A | Discharge status: Home / Self Care | Payer: Medicare PPO | Source: Ambulatory Visit | Attending: Neurology | Admitting: Neurology

## 2023-05-17 DIAGNOSIS — G35 Multiple sclerosis: Secondary | ICD-10-CM

## 2023-05-17 DIAGNOSIS — R42 Dizziness and giddiness: Secondary | ICD-10-CM | POA: Diagnosis not present

## 2023-05-17 MED ORDER — GADOPICLENOL 0.5 MMOL/ML IV SOLN
5.5000 mL | Freq: Once | INTRAVENOUS | Status: AC | PRN
Start: 2023-05-17 — End: 2023-05-17
  Administered 2023-05-17: 5.5 mL via INTRAVENOUS

## 2023-06-19 ENCOUNTER — Ambulatory Visit: Payer: Medicare PPO

## 2023-06-20 ENCOUNTER — Ambulatory Visit: Payer: Medicare PPO

## 2023-06-24 ENCOUNTER — Ambulatory Visit: Payer: 59 | Admitting: Internal Medicine

## 2023-06-25 DIAGNOSIS — L570 Actinic keratosis: Secondary | ICD-10-CM | POA: Diagnosis not present

## 2023-06-25 DIAGNOSIS — Z85820 Personal history of malignant melanoma of skin: Secondary | ICD-10-CM | POA: Diagnosis not present

## 2023-06-25 DIAGNOSIS — L821 Other seborrheic keratosis: Secondary | ICD-10-CM | POA: Diagnosis not present

## 2023-06-25 DIAGNOSIS — D225 Melanocytic nevi of trunk: Secondary | ICD-10-CM | POA: Diagnosis not present

## 2023-06-25 DIAGNOSIS — Z08 Encounter for follow-up examination after completed treatment for malignant neoplasm: Secondary | ICD-10-CM | POA: Diagnosis not present

## 2023-06-25 DIAGNOSIS — Z86006 Personal history of melanoma in-situ: Secondary | ICD-10-CM | POA: Diagnosis not present

## 2023-07-16 ENCOUNTER — Other Ambulatory Visit: Payer: Self-pay | Admitting: Internal Medicine

## 2023-07-16 DIAGNOSIS — E782 Mixed hyperlipidemia: Secondary | ICD-10-CM

## 2023-07-17 NOTE — Telephone Encounter (Signed)
Request is too soon, last refill 04/21/23 for 90 and 1 refill.  Requested Prescriptions  Pending Prescriptions Disp Refills   rosuvastatin (CRESTOR) 10 MG tablet [Pharmacy Med Name: ROSUVASTATIN CALCIUM 10 MG TAB] 90 tablet 1    Sig: TAKE ONE TABLET BY MOUTH EVERY DAY     Cardiovascular:  Antilipid - Statins 2 Failed - 07/16/2023  3:29 PM      Failed - Lipid Panel in normal range within the last 12 months    Cholesterol, Total  Date Value Ref Range Status  04/29/2022 202 (H) 100 - 199 mg/dL Final   Cholesterol  Date Value Ref Range Status  04/18/2023 216 (H) <200 mg/dL Final   LDL Cholesterol (Calc)  Date Value Ref Range Status  04/18/2023 121 (H) mg/dL (calc) Final    Comment:    Reference range: <100 . Desirable range <100 mg/dL for primary prevention;   <70 mg/dL for patients with CHD or diabetic patients  with > or = 2 CHD risk factors. Marland Kitchen LDL-C is now calculated using the Martin-Hopkins  calculation, which is a validated novel method providing  better accuracy than the Friedewald equation in the  estimation of LDL-C.  Horald Pollen et al. Lenox Ahr. 6962;952(84): 2061-2068  (http://education.[facility].com/faq/FAQ164)    HDL  Date Value Ref Range Status  04/18/2023 70 > OR = 50 mg/dL Final  13/24/4010 69 >27 mg/dL Final   Triglycerides  Date Value Ref Range Status  04/18/2023 130 <150 mg/dL Final         Passed - Cr in normal range and within 360 days    Creat  Date Value Ref Range Status  04/18/2023 0.74 0.60 - 1.00 mg/dL Final         Passed - Patient is not pregnant      Passed - Valid encounter within last 12 months    Recent Outpatient Visits           2 months ago Mixed hyperlipidemia   [facility] Margarita Mail, DO   3 months ago Annual physical exam   [facility] Margarita Mail, DO   6 months ago Age-related osteoporosis without current pathological fracture   [facility] Margarita Mail, DO   10 months ago Localized osteoporosis without current pathological fracture   [facility] Margarita Mail, DO   1 year ago Multiple sclerosis [facility])   [facility] Margarita Mail, DO       Future Appointments             In 3 months Margarita Mail, DO [facility]

## 2023-07-19 ENCOUNTER — Other Ambulatory Visit: Payer: Self-pay | Admitting: Neurology

## 2023-07-22 NOTE — Telephone Encounter (Signed)
Last seen on 05/06/23 Follow up scheduled on 05/05/24 Last filled on 03/04/23 #180 tablets (90 day supply) Rx pending to be signed

## 2023-10-16 ENCOUNTER — Other Ambulatory Visit: Payer: Self-pay | Admitting: Internal Medicine

## 2023-10-16 DIAGNOSIS — E782 Mixed hyperlipidemia: Secondary | ICD-10-CM

## 2023-10-16 NOTE — Telephone Encounter (Signed)
Requested Prescriptions  Pending Prescriptions Disp Refills   rosuvastatin (CRESTOR) 10 MG tablet [Pharmacy Med Name: ROSUVASTATIN CALCIUM 10 MG TAB] 90 tablet 2    Sig: TAKE ONE TABLET BY MOUTH EVERY DAY     Cardiovascular:  Antilipid - Statins 2 Failed - 10/16/2023  9:35 AM      Failed - Lipid Panel in normal range within the last 12 months    Cholesterol, Total  Date Value Ref Range Status  04/29/2022 202 (H) 100 - 199 mg/dL Final   Cholesterol  Date Value Ref Range Status  04/18/2023 216 (H) <200 mg/dL Final   LDL Cholesterol (Calc)  Date Value Ref Range Status  04/18/2023 121 (H) mg/dL (calc) Final    Comment:    Reference range: <100 . Desirable range <100 mg/dL for primary prevention;   <70 mg/dL for patients with CHD or diabetic patients  with > or = 2 CHD risk factors. Marland Kitchen LDL-C is now calculated using the Martin-Hopkins  calculation, which is a validated novel method providing  better accuracy than the Friedewald equation in the  estimation of LDL-C.  Horald Pollen et al. Lenox Ahr. 1610;960(45): 2061-2068  (http://education.[facility].com/faq/FAQ164)    HDL  Date Value Ref Range Status  04/18/2023 70 > OR = 50 mg/dL Final  40/98/1191 69 >47 mg/dL Final   Triglycerides  Date Value Ref Range Status  04/18/2023 130 <150 mg/dL Final         Passed - Cr in normal range and within 360 days    Creat  Date Value Ref Range Status  04/18/2023 0.74 0.60 - 1.00 mg/dL Final         Passed - Patient is not pregnant      Passed - Valid encounter within last 12 months    Recent Outpatient Visits           5 months ago Mixed hyperlipidemia   [facility] Margarita Mail, DO   6 months ago Annual physical exam   [facility] Margarita Mail, DO   9 months ago Age-related osteoporosis without current pathological fracture   [facility] Margarita Mail, DO   1 year ago Localized  osteoporosis without current pathological fracture   [facility] Margarita Mail, DO   1 year ago Multiple sclerosis [facility])   [facility] Margarita Mail, DO       Future Appointments             In 3 weeks Margarita Mail, DO [facility]

## 2023-11-10 ENCOUNTER — Ambulatory Visit: Payer: Medicare PPO | Admitting: Internal Medicine

## 2023-11-21 ENCOUNTER — Other Ambulatory Visit: Payer: Self-pay | Admitting: Neurology

## 2023-11-24 NOTE — Telephone Encounter (Signed)
Last seen on 05/06/23 Follow up scheduled on 05/05/24

## 2023-11-27 DIAGNOSIS — J069 Acute upper respiratory infection, unspecified: Secondary | ICD-10-CM | POA: Diagnosis not present

## 2023-11-27 DIAGNOSIS — R52 Pain, unspecified: Secondary | ICD-10-CM | POA: Diagnosis not present

## 2023-11-27 DIAGNOSIS — R509 Fever, unspecified: Secondary | ICD-10-CM | POA: Diagnosis not present

## 2023-12-01 ENCOUNTER — Telehealth: Payer: Self-pay | Admitting: Neurology

## 2023-12-01 NOTE — Telephone Encounter (Signed)
Pt reports that she has a respiratory infection , she went to a walk in clinic, and was advised to take Mucinex, she states when she was under the care of Dr Anne Hahn he did not suggest she take this.  Pt would like to know what Dr Epimenio Foot has to say about her taking Mucinex.

## 2023-12-09 DIAGNOSIS — I10 Essential (primary) hypertension: Secondary | ICD-10-CM | POA: Diagnosis not present

## 2023-12-09 DIAGNOSIS — M810 Age-related osteoporosis without current pathological fracture: Secondary | ICD-10-CM | POA: Diagnosis not present

## 2023-12-09 DIAGNOSIS — G35 Multiple sclerosis: Secondary | ICD-10-CM | POA: Diagnosis not present

## 2023-12-09 DIAGNOSIS — E559 Vitamin D deficiency, unspecified: Secondary | ICD-10-CM | POA: Diagnosis not present

## 2023-12-09 DIAGNOSIS — K5190 Ulcerative colitis, unspecified, without complications: Secondary | ICD-10-CM | POA: Diagnosis not present

## 2023-12-11 NOTE — Telephone Encounter (Signed)
 Copied from CRM [id number]. Topic: General - Inquiry >> Dec 11, 2023 11:07 AM Myrick T wrote: Reason for CRM: patient called said she got a shot at a different location not in the [facility] system but she wntd to make sure her provider recvd that info. She also wants to know when she was put on rosuvastatin  (CRESTOR ) 10 MG tablet. Please f/u with patient

## 2023-12-11 NOTE — Telephone Encounter (Signed)
 Received her first Prolia shot December 31,2024.

## 2023-12-15 ENCOUNTER — Encounter: Payer: Self-pay | Admitting: Internal Medicine

## 2023-12-15 ENCOUNTER — Other Ambulatory Visit: Payer: Self-pay

## 2023-12-15 ENCOUNTER — Ambulatory Visit: Payer: Medicare PPO | Admitting: Internal Medicine

## 2023-12-15 VITALS — BP 120/76 | HR 85 | Temp 97.9°F | Resp 16 | Ht 59.0 in | Wt 112.5 lb

## 2023-12-15 DIAGNOSIS — G35 Multiple sclerosis: Secondary | ICD-10-CM | POA: Diagnosis not present

## 2023-12-15 DIAGNOSIS — E782 Mixed hyperlipidemia: Secondary | ICD-10-CM | POA: Insufficient documentation

## 2023-12-15 DIAGNOSIS — M810 Age-related osteoporosis without current pathological fracture: Secondary | ICD-10-CM | POA: Diagnosis not present

## 2023-12-15 DIAGNOSIS — R1902 Left upper quadrant abdominal swelling, mass and lump: Secondary | ICD-10-CM

## 2023-12-15 NOTE — Assessment & Plan Note (Signed)
Stable, following with neurology.  

## 2023-12-15 NOTE — Assessment & Plan Note (Signed)
 Now taking Crestor 10 mg daily, plan to check fasting labs at follow-up.
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
## 2023-12-15 NOTE — Assessment & Plan Note (Signed)
 Now following with endocrinology, had her first Prolia shot on 12/09/2023.

## 2024-01-09 ENCOUNTER — Other Ambulatory Visit: Payer: Self-pay

## 2024-01-09 ENCOUNTER — Ambulatory Visit
Admission: RE | Admit: 2024-01-09 | Discharge: 2024-01-09 | Disposition: A | Discharge status: Home / Self Care | Payer: Medicare PPO | Source: Ambulatory Visit | Attending: Internal Medicine | Admitting: Internal Medicine

## 2024-01-09 DIAGNOSIS — R1902 Left upper quadrant abdominal swelling, mass and lump: Secondary | ICD-10-CM | POA: Insufficient documentation

## 2024-01-09 DIAGNOSIS — Z1231 Encounter for screening mammogram for malignant neoplasm of breast: Secondary | ICD-10-CM | POA: Diagnosis not present

## 2024-01-09 DIAGNOSIS — R1900 Intra-abdominal and pelvic swelling, mass and lump, unspecified site: Secondary | ICD-10-CM | POA: Diagnosis not present

## 2024-01-09 LAB — HM MAMMOGRAPHY

## 2024-01-16 ENCOUNTER — Telehealth: Payer: Self-pay | Admitting: Internal Medicine

## 2024-01-16 ENCOUNTER — Telehealth: Payer: Self-pay | Admitting: Emergency Medicine

## 2024-01-16 NOTE — Telephone Encounter (Signed)
 Patient notified. SHe stated she had a report did at [facility]

## 2024-01-16 NOTE — Telephone Encounter (Signed)
 Per Dr.ANdrews mammogram negative repeat in one year

## 2024-01-16 NOTE — Telephone Encounter (Signed)
 Spoke to patient and asked her to send us  name to cardiovascular doctor in [facility] that she want to see and we will send  a referral along with medical records

## 2024-01-16 NOTE — Telephone Encounter (Signed)
 Patient US  was negative, she stated she still in pain. What will be next

## 2024-01-16 NOTE — Telephone Encounter (Signed)
-----   Message from Sharyle Fischer sent at 01/16/2024  3:25 PM EST ----- Regarding: Breast Arterial Calficiations I did not order this test but according to her [facility] report she has calcifications in the arterial supply in the right breast. This is atherosclerosis or plaque deposits in the arteries. This test was only looking at the arteries in the breasts, it would be reasonable to do a similar test looking at the cardiac blood vessels, I can order this for her if she is interested. She is on her statin which will help further plaque deposition, can also start a daily aspirin as well for further cardiovascular protection.

## 2024-01-16 NOTE — Telephone Encounter (Signed)
 Copied from CRM [id number]. Topic: General - Other >> Jan 16, 2024 12:21 PM Tobias L wrote: Reason for CRM: PT calling back to follow up on message about imaging results from 01/12/2024. Pt has not heard back from anyone at the office. Pt is very upset about this. Pt also states her mammogram results were sent to Dr. Bernardo and has not heard back about those as well. Offered to get patient connected to NT to speak to someone. Pt declined and stated she would prefer to speak to provider directly.   Pt then declined for message to be sent in and stated she would let [facility] know about her experience.   FYI

## 2024-01-18 IMAGING — US US ABDOMEN COMPLETE
1 series · 13 of 25 positions shown · non-contrast
Comparison: None.

CLINICAL DATA: Epigastric pain

EXAM:
ABDOMEN ULTRASOUND COMPLETE

[Series 1: abdomen us · 13 of 147 slices shown]
[im 1/147]
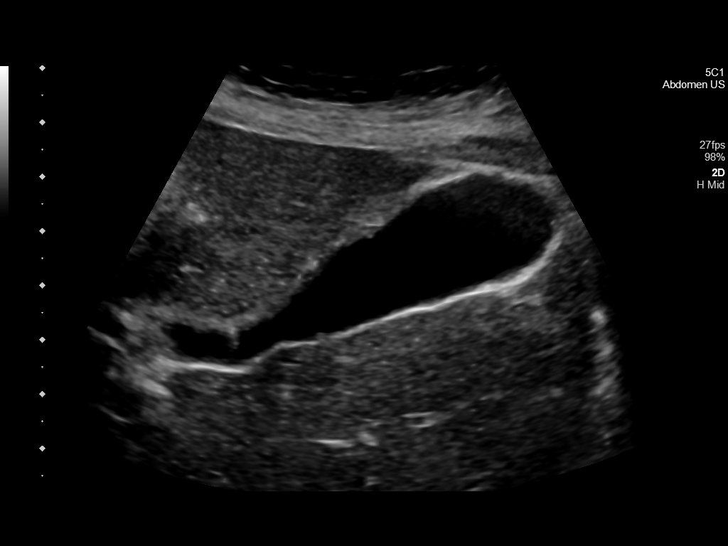
[im 13/147]
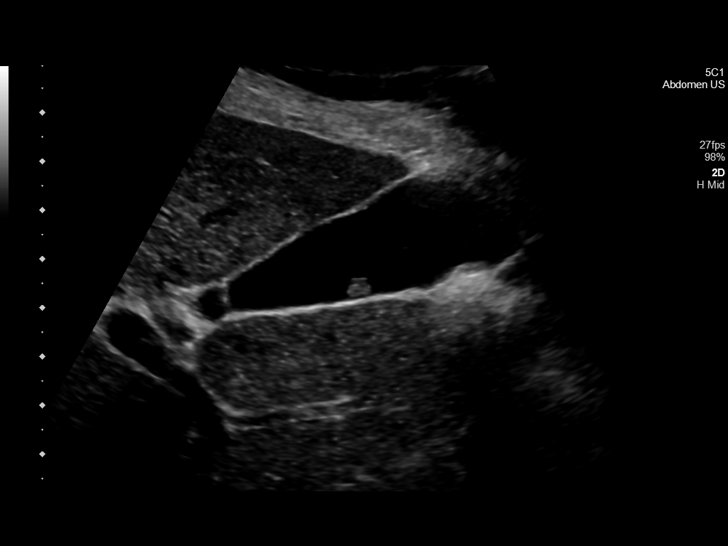
[im 25/147]
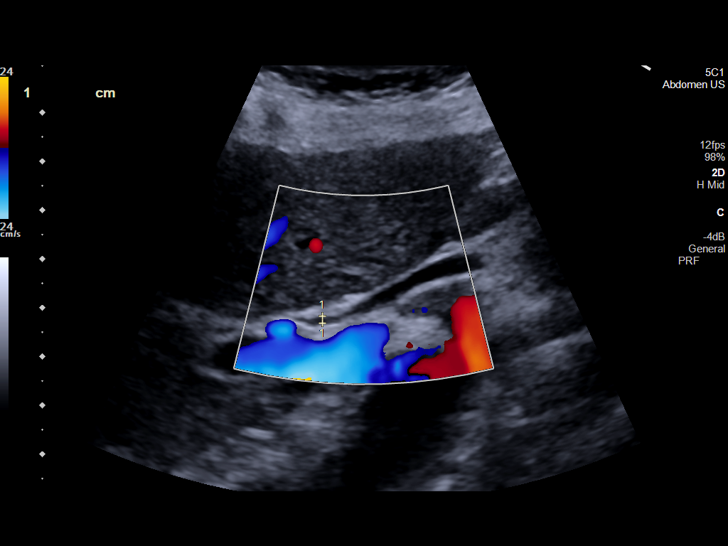
[im 37/147]
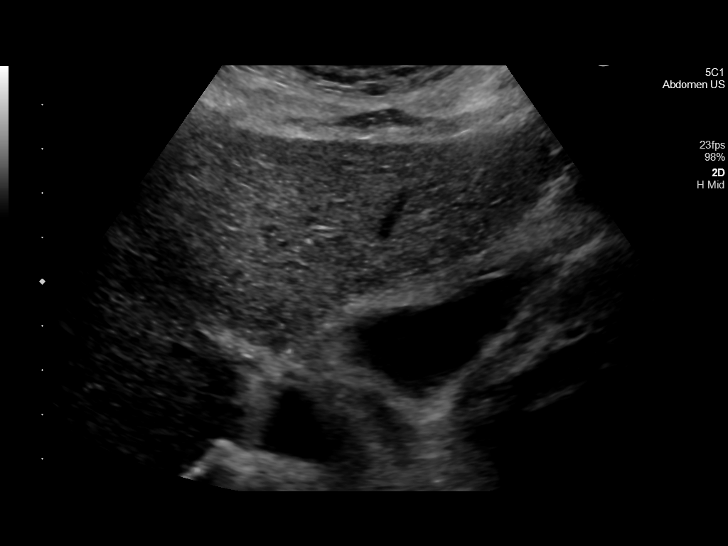
[im 49/147]
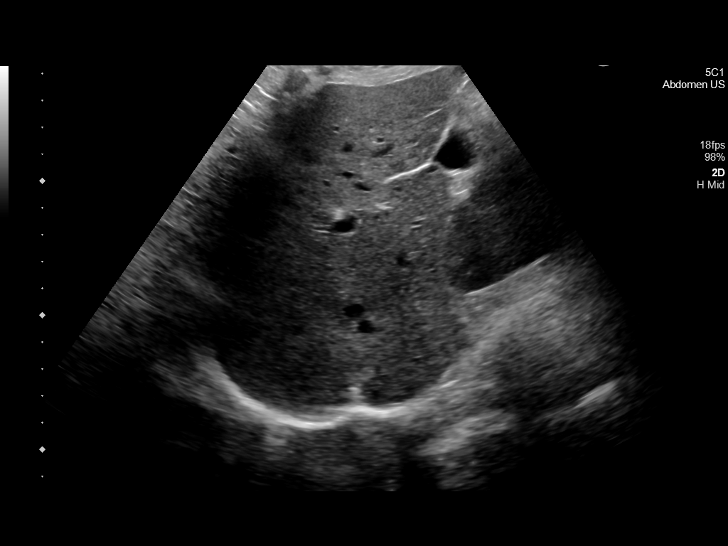
[im 61/147]
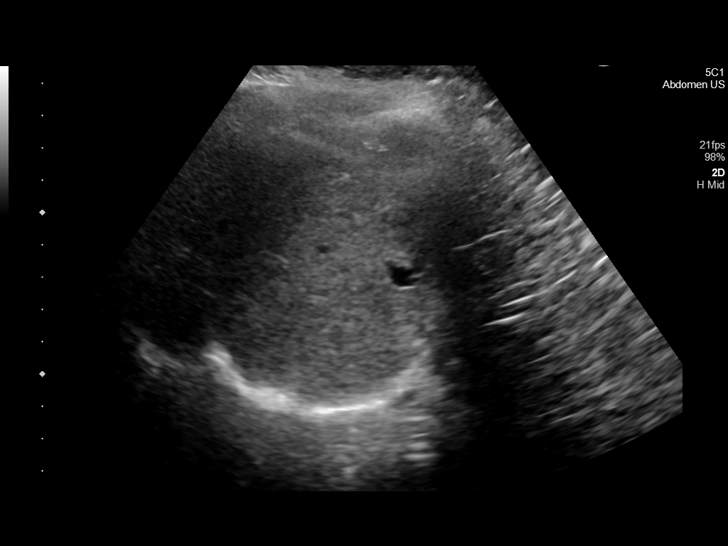
[im 74/147]
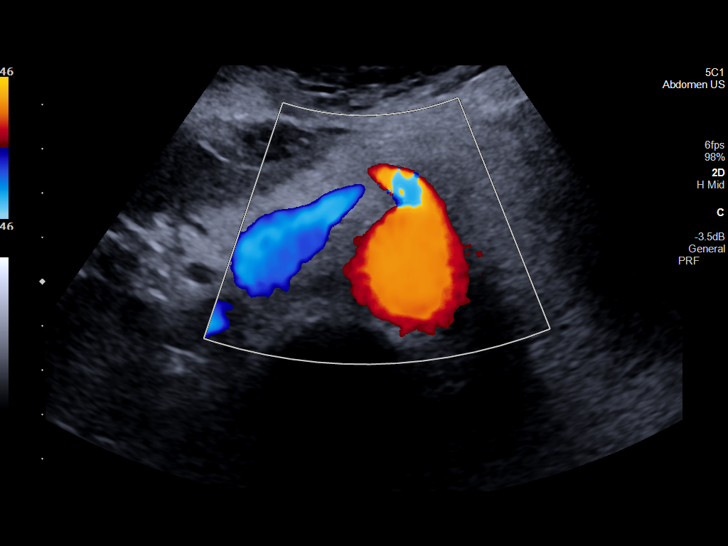
[im 86/147]
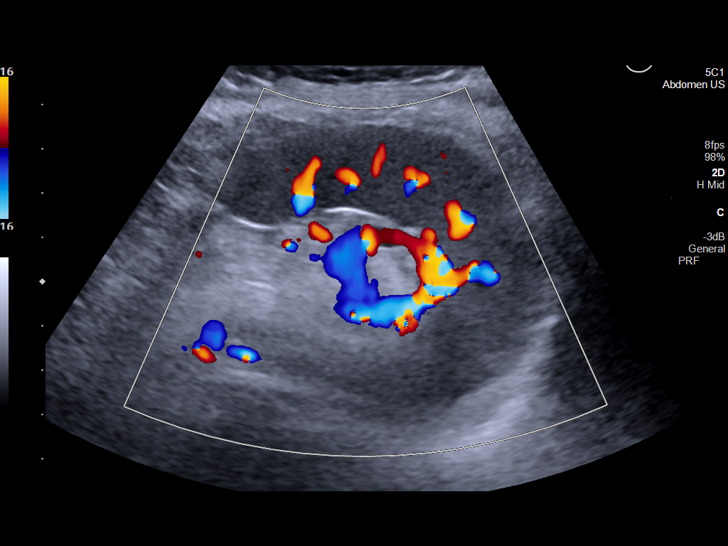
[im 98/147]
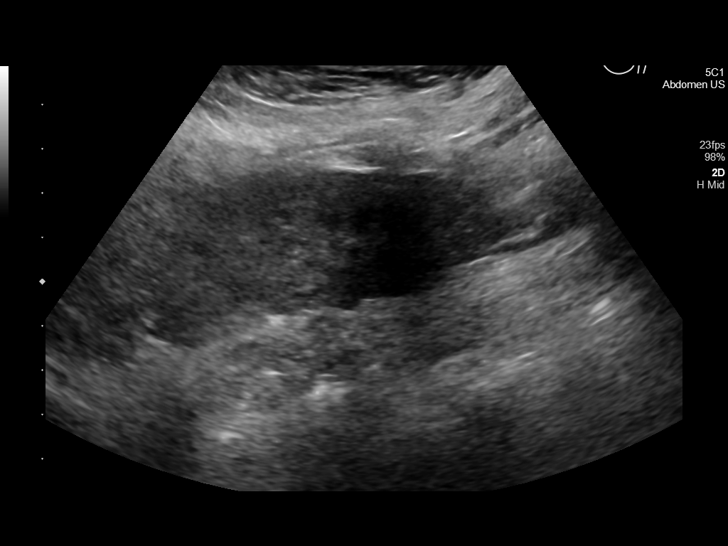
[im 110/147]
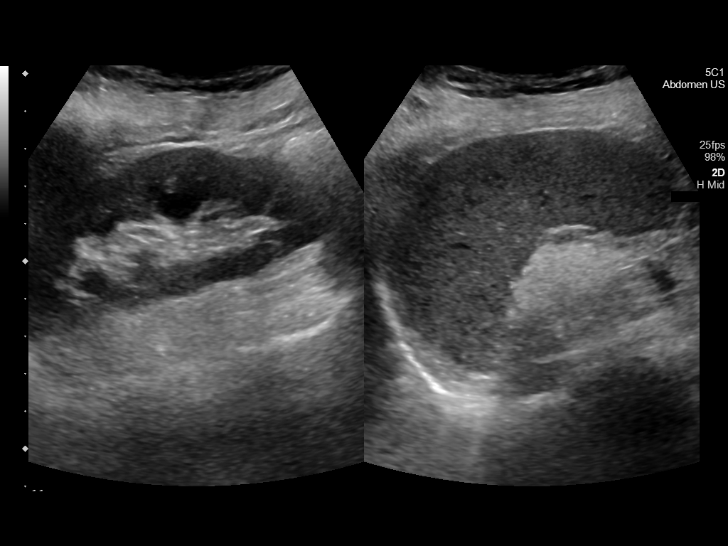
[im 122/147]
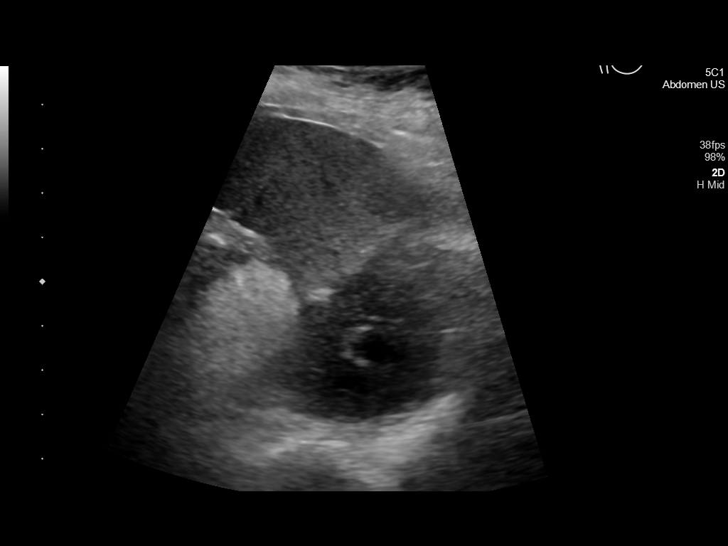
[im 134/147]
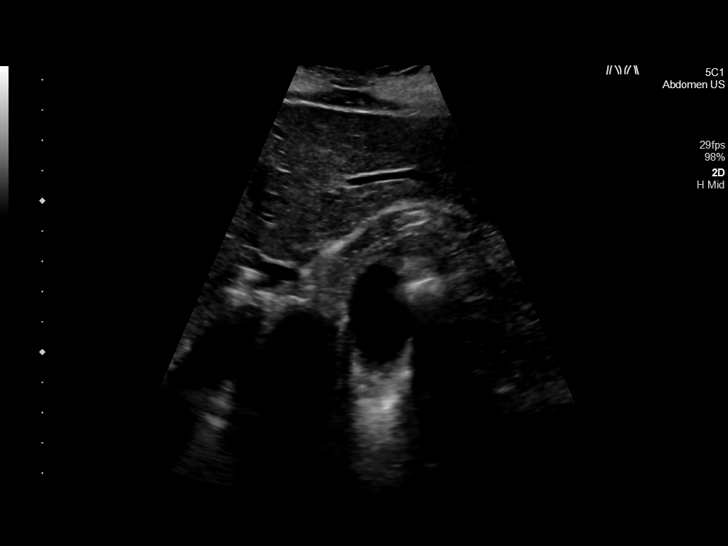
[im 147/147]
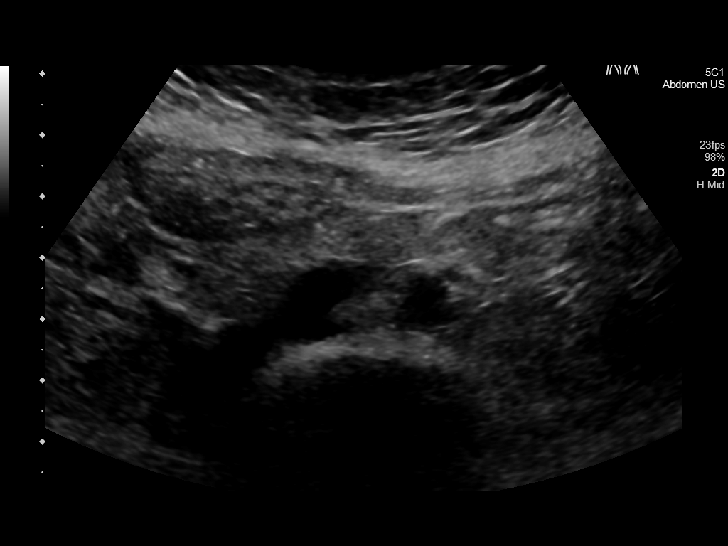

[13 of 25 positions shown; findings below may reference images not displayed]

FINDINGS: Gallbladder: No gallstones or wall thickening visualized.
Gallbladder polyp measuring 5 mm. No sonographic Murphy sign noted
by sonographer.

Common bile duct: Diameter: 1.5 mm

Liver: No focal lesion identified. Within normal limits in
parenchymal echogenicity. Portal vein is patent on color Doppler
imaging with normal direction of blood flow towards the liver.

IVC: No abnormality visualized.

Pancreas: Visualized portion unremarkable.

Spleen: Size and appearance within normal limits.

Right Kidney: Length: 10.6 cm. Echogenicity within normal limits. No
mass or hydronephrosis visualized.

Left Kidney: Length: 10.1 cm. Echogenicity within normal limits. No
mass or hydronephrosis visualized.

Abdominal aorta: No aneurysm visualized.

Other findings: None.
IMPRESSION: 1. No sonographic findings to explain epigastric pain.
2. Gallbladder polyp measuring up to 5 mm, likely benign with no
further follow-up needed. This recommendation follows ACR consensus
guidelines: White Paper of the ACR Incidental Findings Committee II
on Gallbladder and Biliary Findings. [HOSPITAL]

## 2024-01-26 ENCOUNTER — Other Ambulatory Visit: Payer: Self-pay | Admitting: Neurology

## 2024-01-26 MED ORDER — CLONAZEPAM 0.5 MG PO TABS
0.5000 mg | ORAL_TABLET | Freq: Two times a day (BID) | ORAL | 0 refills | Status: AC
Start: 2024-01-26 — End: ?

## 2024-01-26 NOTE — Telephone Encounter (Signed)
 Called pt and relayed Dr. Epimenio Foot called in refill as requested. She verbalized understanding.  Reminded her about upcoming appt 05/05/24 at 1:30pm.

## 2024-01-26 NOTE — Telephone Encounter (Signed)
 Pt is in [facility] with sister(who is recovering from surgery) she is asking if Dr Epimenio Foot can call in her  clonazePAM (KLONOPIN) 0.5 MG tablet to [facility] [address] [place], [place], [place] [address]  [phone]

## 2024-01-26 NOTE — Telephone Encounter (Signed)
 Dr. Epimenio Foot- please e-scribe if you agree. No mention in last OV if you were going to continue refilling.  Pt last seen 05/06/23 and next f/u 05/05/24. Last refilled clonazepam 12/08/23 #60.

## 2024-03-09 ENCOUNTER — Other Ambulatory Visit: Payer: Self-pay | Admitting: Internal Medicine

## 2024-03-09 DIAGNOSIS — E782 Mixed hyperlipidemia: Secondary | ICD-10-CM

## 2024-03-09 NOTE — Telephone Encounter (Signed)
 Copied from CRM [id number]. Topic: Clinical - Medication Refill >> Mar 09, 2024 11:51 AM [name] wrote: Most Recent Primary Care Visit:  Provider: Margarita Mail  Department: [facility]  Visit Type: OFFICE VISIT  Date: 12/15/2023  Medication: rosuvastatin (CRESTOR) 10 MG tablet  Has the patient contacted their pharmacy? Yes  Is this the correct pharmacy for this prescription? Yes  This is the patient's preferred pharmacy:   [facility] [place], [place] - [address] [place] [address] Phone: [phone] Fax: [phone]  Has the prescription been filled recently? No  Is the patient out of the medication? Yes  Has the patient been seen for an appointment in the last year OR does the patient have an upcoming appointment? Yes  Can we respond through MyChart? No  Agent: Please be advised that Rx refills may take up to 3 business days. We ask that you follow-up with your pharmacy.

## 2024-03-10 ENCOUNTER — Other Ambulatory Visit: Payer: Self-pay | Admitting: Internal Medicine

## 2024-03-10 DIAGNOSIS — E782 Mixed hyperlipidemia: Secondary | ICD-10-CM

## 2024-03-10 NOTE — Telephone Encounter (Signed)
 Copied from CRM [id number]. Topic: Clinical - Medication Refill >> Mar 10, 2024 10:14 AM Carlatta H wrote: Most Recent Primary Care Visit:  Provider: Margarita Mail  Department: [facility]  Visit Type: OFFICE VISIT  Date: 12/15/2023  Medication: rosuvastatin (CRESTOR) 10 MG tablet [[id number]]  Has the patient contacted their pharmacy? No (Agent: If no, request that the patient contact the pharmacy for the refill. If patient does not wish to contact the pharmacy document the reason why and proceed with request.) (Agent: If yes, when and what did the pharmacy advise?)  Is this the correct pharmacy for this prescription? Yes If no, delete pharmacy and type the correct one.  This is the patient's preferred pharmacy:    [facility] - [place], [place] - [address] [place] [address] Phone: [phone] Fax: [phone]   Has the prescription been filled recently? No  Is the patient out of the medication? Yes  Has the patient been seen for an appointment in the last year OR does the patient have an upcoming appointment? No  Can we respond through MyChart? Yes  Agent: Please be advised that Rx refills may take up to 3 business days. We ask that you follow-up with your pharmacy.

## 2024-03-11 ENCOUNTER — Telehealth: Payer: Self-pay

## 2024-03-11 DIAGNOSIS — E782 Mixed hyperlipidemia: Secondary | ICD-10-CM

## 2024-03-11 MED ORDER — ROSUVASTATIN CALCIUM 10 MG PO TABS: 10.0000 mg | ORAL_TABLET | Freq: Every day | ORAL | 0 refills | Status: AC

## 2024-03-11 NOTE — Telephone Encounter (Signed)
 Refills sent

## 2024-05-05 ENCOUNTER — Encounter: Payer: Self-pay | Admitting: Neurology

## 2024-05-05 ENCOUNTER — Ambulatory Visit: Payer: Medicare PPO | Admitting: Neurology

## 2024-05-05 DIAGNOSIS — G35 Multiple sclerosis: Secondary | ICD-10-CM | POA: Diagnosis not present

## 2024-05-05 DIAGNOSIS — M7551 Bursitis of right shoulder: Secondary | ICD-10-CM

## 2024-05-05 DIAGNOSIS — G43109 Migraine with aura, not intractable, without status migrainosus: Secondary | ICD-10-CM | POA: Diagnosis not present

## 2024-05-05 DIAGNOSIS — M810 Age-related osteoporosis without current pathological fracture: Secondary | ICD-10-CM

## 2024-05-05 MED ORDER — METHYLPREDNISOLONE ACETATE 80 MG/ML IJ SUSP
80.0000 mg | Freq: Once | INTRAMUSCULAR | Status: AC
  Administered 2024-05-05: 20 mg via INTRAMUSCULAR

## 2024-05-05 MED ORDER — METHYLPREDNISOLONE ACETATE 80 MG/ML IJ SUSP
20.0000 mg | Freq: Once | INTRAMUSCULAR | Status: AC
  Administered 2024-05-05: 20 mg via INTRAMUSCULAR

## 2024-05-05 NOTE — Progress Notes (Signed)
 [facility]  PATIENT: Makayla Wade DOB: October 03, 1951  REFERRING DOCTOR OR PCP: Rockney Cid, DO SOURCE: Patient, notes from Dr. Tilda Fogo, imaging and lab reports, MRI images personally reviewed.  _________________________________   HISTORICAL  CHIEF COMPLAINT:  Chief Complaint  Patient presents with   Follow-up    Pt in room 11 alone. Here for MS follow up. Pt reports gait on left side is worse, pt reports left foot drags, has couple of falls this year. Pt is under a lot of stress due to her sister health. Pt started  Prolia  in December. Pt report migraines have improved, pt only took [place] one time only.    HISTORY OF PRESENT ILLNESS:  Makayla Wade is a 73 y.o.  woman with multiple sclerosis.     Update 05/05/2024: She has had MS for more than 40 years and is not on a disease modifying therapy at this time.  She has been neurologically stable.    Gait and balance are stable with mild reduced balance but feels she keeps up with friends..  She fell hitting her head last month.   Sometimes the right side feels a little spastic.  No change in vision or bowel or bladder function had been noted..  Mood is fine.   Cognition is doing well.      She has right shoulder pain, worse with arm raised and rotated back.  In the past, she was told she had bursitis she is noting left foot numbness and pain  She has had more stress with her sisters health issues (AAA and memory decline).  She has moved to [facility] helping her sister who had a ruptured AAA  She has uncomfortable dysesthesias in her feet, worse when sitting after she walks.    She has a history of migraine headache primarily visual aura x 30-40 minutes with headache but the patient feels wiped out and spacey afterwards.  The episodes occur 3-4 times a month now.    She was tried on propranolol  without a benefit.   She was placed on rizatriptan  but it did not help.    These have done much better lately.   She was  found to have elevated cholesterol and started Crestor .   She has osteoporosis and does Prolia .    MS History At age 3 (24) she had tingling in her hands that lasted several weeks.   She had a few other episodes of tingling over the next few years.   In 1976, she had serial CT scans (no MRI's) due to a focus that was seen.    MS was entertained (vs tumor).  In 1984, she moved to [place] and was unable to get any imaging.  She had a LP at [facility] (Dr. Tova Fresh) and it was c/w MS.   She had an MRI in 1984 and it was consistent with MS.    She was never on a DMT.   Her last exacerbation was probably in the 1980's/1990's.   She did have spells of spasticity, that were initially thought to be seizure.  She no longer gets these type of spells.  Imaging personally reviewed:  MRI of the brain 05/19/2014 and 02/08/2018 showed an identical pattern of T2/FLAIR hyperintense foci in the periventricular, juxtacortical and deep white matter consistent with chronic demyelinating plaque associated with multiple sclerosis.  None of the foci appear to be acute they do not enhance.  No changes over the 4 years.  MRI brain 05/17/2023 showed Multiple T2/FLAIR  hyperintense foci in the cerebral hemispheres and a couple foci on the cervicomedullary junction in a pattern consistent with chronic demyelinating plaque associated with multiple sclerosis. None of the foci appear to be acute. They do not enhance. Compared to the MRI from 02/25/2018, there did not appear to be any new lesions.    REVIEW OF SYSTEMS: Constitutional: No fevers, chills, sweats, or change in appetite Eyes: No visual changes, double vision, eye pain Ear, nose and throat: No hearing loss, ear pain, nasal congestion, sore throat Cardiovascular: No chest pain, palpitations Respiratory:  No shortness of breath at rest or with exertion.   No wheezes GastrointestinaI: No nausea, vomiting, diarrhea, abdominal pain, fecal incontinence Genitourinary:  No dysuria, urinary  retention or frequency.  No nocturia. Musculoskeletal:  Some back pain.   Right shoulder pain Integumentary: No rash, pruritus, skin lesions Neurological: as above Psychiatric: No depression at this time.  No anxiety Endocrine: No palpitations, diaphoresis, change in appetite, change in weigh or increased thirst Hematologic/Lymphatic:  No anemia, purpura, petechiae. Allergic/Immunologic: No itchy/runny eyes, nasal congestion, recent allergic reactions, rashes  ALLERGIES: Allergies  Allergen Reactions   Sulfa Antibiotics Other (See Comments)    Funny sensations, rapid heartbeats   Copper-Containing Compounds     HOME MEDICATIONS:  Current Outpatient Medications:    Ascorbic Acid (VITAMIN C) 100 MG tablet, Take by mouth., Disp: , Rfl:    aspirin 81 MG chewable tablet, Chew by mouth daily., Disp: , Rfl:    baclofen  (LIORESAL ) 10 MG tablet, TAKE 1 TABLET BY MOUTH 2 TIMES DAILY, Disp: 180 each, Rfl: 1   Calcium  Carbonate-Vitamin D  600-400 MG-UNIT tablet, Take 1 tablet by mouth daily., Disp: , Rfl:    cholecalciferol (VITAMIN D ) 1000 UNITS tablet, Take 1,000 Units by mouth daily., Disp: , Rfl:    clonazePAM  (KLONOPIN ) 0.5 MG tablet, Take 1 tablet (0.5 mg total) by mouth 2 (two) times daily., Disp: 180 tablet, Rfl: 0   Cyanocobalamin  (VITAMIN B 12 PO), Take by mouth daily., Disp: , Rfl:    denosumab  (PROLIA ) 60 MG/ML SOSY injection, Inject 60 mg into the skin every 6 (six) months., Disp: , Rfl:    Multiple Vitamins-Minerals (CENTRUM SILVER ADULT 50+ PO), Take 1 tablet by mouth daily., Disp: , Rfl:    rosuvastatin  (CRESTOR ) 10 MG tablet, Take 1 tablet (10 mg total) by mouth daily., Disp: 90 tablet, Rfl: 0  Current Facility-Administered Medications:    methylPREDNISolone acetate (DEPO-MEDROL) injection 20 mg, 20 mg, Intramuscular, Once, Jens Siems A, MD   methylPREDNISolone acetate (DEPO-MEDROL) injection 80 mg, 80 mg, Intramuscular, Once,   PAST MEDICAL HISTORY: Past Medical History:   Diagnosis Date   Anxiety    Depression    Hx.   Elevated blood pressure reading    Hemorrhoids    Hx.   Hypertension    Migraine headache    Multiple sclerosis (HCC)    Optic neuritis, right    Osteoporosis    Seizures (HCC)    Hx.    Syncope    Tonic seizures (HCC)    prior to 1984, when started on meds for MS   Ulcerative colitis (HCC)     PAST SURGICAL HISTORY: Past Surgical History:  Procedure Laterality Date   BREAST LUMPECTOMY     CATARACT EXTRACTION W/PHACO Left 07/24/2022   Procedure: CATARACT EXTRACTION PHACO AND INTRAOCULAR LENS PLACEMENT (IOC) LEFT  TORIC LENS;  Surgeon: Annell Kidney, MD;  Location: [facility];  Service: Ophthalmology;  Laterality: Left;  4.92  0:41.8   CATARACT EXTRACTION W/PHACO Right 08/07/2022   Procedure: CATARACT EXTRACTION PHACO AND INTRAOCULAR LENS PLACEMENT (IOC) RIGHT  TORIC LENS 7.13 01:03.0;  Surgeon: Annell Kidney, MD;  Location: [facility];  Service: Ophthalmology;  Laterality: Right;   CESAREAN SECTION     FOOT SURGERY     INCONTINENCE SURGERY     OVARIAN CYST SURGERY      FAMILY HISTORY: Family History  Problem Relation Age of Onset   Cancer Mother    Heart attack Father    Cancer Father    Cancer Maternal Grandmother    Cancer Paternal Grandfather     SOCIAL HISTORY:  Social History   Socioeconomic History   Marital status: Divorced    Spouse name: Not on file   Number of children: 3   Years of education: HS   Highest education level: 12th grade  Occupational History   Occupation: Information systems manager    Comment: [employer]  Tobacco Use   Smoking status: Former    Current packs/day: 0.00    Types: Cigarettes    Quit date: 07/09/2021    Years since quitting: 2.8   Smokeless tobacco: Never  Vaping Use   Vaping status: Never Used  Substance and Sexual Activity   Alcohol use: Yes    Alcohol/week: 14.0 standard drinks of alcohol    Types: 14 Glasses of  wine per week    Comment: Consumes 2 glasses of wine daily about    Drug use: No   Sexual activity: Not Currently  Other Topics Concern   Not on file  Social History Narrative   Patient is right handed,resides alone      Patient drinks about 1-2 cups of caffeine daily.    Social Drivers of Corporate investment banker Strain: Low Risk  (12/11/2023)   Overall Financial Resource Strain (CARDIA)    Difficulty of Paying Living Expenses: Not hard at all  Food Insecurity: No Food Insecurity (12/11/2023)   Hunger Vital Sign    Worried About Running Out of Food in the Last Year: Never true    Ran Out of Food in the Last Year: Never true  Transportation Needs: No Transportation Needs (12/11/2023)   PRAPARE - Administrator, Civil Service (Medical): No    Lack of Transportation (Non-Medical): No  Physical Activity: Unknown (12/11/2023)   Exercise Vital Sign    Days of Exercise per Week: 0 days    Minutes of Exercise per Session: Not on file  Recent Concern: Physical Activity - Inactive (12/11/2023)   Exercise Vital Sign    Days of Exercise per Week: 0 days    Minutes of Exercise per Session: 20 min  Stress: No Stress Concern Present (12/11/2023)   [employer] of Occupational Health - Occupational Stress Questionnaire    Feeling of Stress : Not at all  Social Connections: Unknown (12/11/2023)   Social Connection and Isolation Panel [NHANES]    Frequency of Communication with Friends and Family: More than three times a week    Frequency of Social Gatherings with Friends and Family: More than three times a week    Attends Religious Services: Patient declined    Database administrator or Organizations: Yes    Attends Banker Meetings: More than 4 times per year    Marital Status: Patient declined  Intimate Partner Violence: Not on file     PHYSICAL EXAM  Vitals:   05/05/24 1325  BP: 130/69  Pulse: 81  Weight: 116 lb 8 oz (52.8 kg)  Height: 4\' 11"  (1.499 m)     Body mass index is 23.53 kg/m.   General: The patient is well-developed and well-nourished and in no acute distress  HEENT: Head is normocephalic and atraumatic.  Sclera are anicteric.  Skin: Extremities are without rash or edema.  Musculoskeletal:  Back is non-tender.   Tender over subacromial bursa on the right.     Neurologic Exam  Mental status: The patient is alert and oriented x 3 at the time of the examination. The patient has apparent normal recent and remote memory, with an apparently normal attention span and concentration ability.   Speech is normal.  Cranial nerves: Extraocular movements are full.  Vision was symmetric.  Facial strength and sensation was normal.  No obvious hearing deficits are noted.  The Weber did not lateralize.  Motor:  Muscle bulk is normal.   Tone is normal. Strength is  5 / 5 in all 4 extremities.   Sensory: Sensory testing shows reduced vibration in left toes only  Coordination: Cerebellar testing reveals good finger-nose-finger and heel-to-shin bilaterally.  Gait and station: Station is normal.   Gait is normal. Tandem gait is mildly wide. Romberg is negative.   Reflexes: Deep tendon reflexes are symmetric and normal bilaterally.   Aaron Aas    DIAGNOSTIC DATA (LABS, IMAGING, TESTING) - I reviewed patient records, labs, notes, testing and imaging myself where available.  Lab Results  Component Value Date   WBC 5.4 04/18/2023   HGB 13.8 04/18/2023   HCT 40.1 04/18/2023   MCV 95.0 04/18/2023   PLT 234 04/18/2023      Component Value Date/Time   NA 143 04/18/2023 1604   NA 140 04/29/2022 0841   K 4.3 04/18/2023 1604   CL 109 04/18/2023 1604   CO2 27 04/18/2023 1604   GLUCOSE 83 04/18/2023 1604   BUN 22 04/18/2023 1604   BUN 16 04/29/2022 0841   CREATININE 0.74 04/18/2023 1604   CALCIUM  9.6 04/18/2023 1604   PROT 6.4 04/18/2023 1604   PROT 7.1 04/29/2022 0841   ALBUMIN 4.5 08/15/2022 2046   ALBUMIN 4.4 04/29/2022 0841   AST 21  04/18/2023 1604   ALT 13 04/18/2023 1604   ALKPHOS 71 08/15/2022 2046   BILITOT 0.4 04/18/2023 1604   BILITOT 0.5 04/29/2022 0841   GFRNONAA >60 08/15/2022 2046   GFRAA 105 05/04/2020 0910   Lab Results  Component Value Date   CHOL 216 (H) 04/18/2023   HDL 70 04/18/2023   LDLCALC 121 (H) 04/18/2023   TRIG 130 04/18/2023   CHOLHDL 3.1 04/18/2023    Lab Results  Component Value Date   TSH 1.36 04/18/2023      ASSESSMENT AND PLAN  Multiple sclerosis (HCC)  Migraine with aura and without status migrainosus, not intractable  Osteoporosis, unspecified osteoporosis type, unspecified pathological fracture presence  Subacromial bursitis of right shoulder joint - Plan: methylPREDNISolone acetate (DEPO-MEDROL) injection 80 mg, methylPREDNISolone acetate (DEPO-MEDROL) injection 20 mg   Her MS was diagnosed in the 1970's and appears to be very stable..  She has no exacerbation x 30 years and last 3 MRI's were stable (2015, 2019 and 2024).   She will remain off a DMT.     Migraines are more rare now.   Maxalt  had not helped.    Florette Hurry helped and could prescribe if these become more frequent. 3.   Stay active and exercise as tolerated. 4.  Subacromial bursa injection (right) with 20 mg Depo-Medrol in 1.5 cc Marcaine using sterile technique without complication.   She tolerated the injection well and pain and ROM was better a couple minutes later.  Rtc one year     Avyana Puffenbarger A. Godwin Lat, MD, [facility] 05/05/2024, 7:59 PM Certified in Neurology, Clinical Neurophysiology, Sleep Medicine and Neuroimaging  [facility] [address], [address] [place], [place] [address] [phone]

## 2024-06-16 ENCOUNTER — Ambulatory Visit: Payer: Self-pay | Admitting: Internal Medicine

## 2024-06-25 ENCOUNTER — Telehealth: Payer: Self-pay

## 2024-06-25 NOTE — Telephone Encounter (Signed)
 Refill request on   rosuvastatin  (CRESTOR ) 10 MG tablet

## 2024-06-25 NOTE — Telephone Encounter (Signed)
 Is this still out patient. I see a [facility]

## 2024-06-25 NOTE — Telephone Encounter (Signed)
 Lvm for pt to return call need to know if she is still a patient of Dr Bernardo

## 2025-05-05 ENCOUNTER — Ambulatory Visit: Admitting: Neurology
